# Patient Record
Sex: Male | Born: 1950 | Race: White | Hispanic: No | Smoking: Never smoker
Health system: Southern US, Community
[De-identification: ages and names within clinical notes are randomized; demographics above are authoritative.]

## PROBLEM LIST (undated history)

## (undated) DIAGNOSIS — I10 Essential (primary) hypertension: Secondary | ICD-10-CM

## (undated) DIAGNOSIS — G4700 Insomnia, unspecified: Secondary | ICD-10-CM

## (undated) DIAGNOSIS — Z6820 Body mass index (BMI) 20.0-20.9, adult: Secondary | ICD-10-CM

## (undated) DIAGNOSIS — E785 Hyperlipidemia, unspecified: Secondary | ICD-10-CM

## (undated) DIAGNOSIS — S62609A Fracture of unspecified phalanx of unspecified finger, initial encounter for closed fracture: Secondary | ICD-10-CM

## (undated) DIAGNOSIS — K5790 Diverticulosis of intestine, part unspecified, without perforation or abscess without bleeding: Secondary | ICD-10-CM

## (undated) DIAGNOSIS — Z860100 Personal history of colon polyps, unspecified: Secondary | ICD-10-CM

## (undated) DIAGNOSIS — N529 Male erectile dysfunction, unspecified: Secondary | ICD-10-CM

## (undated) HISTORY — DX: Male erectile dysfunction, unspecified: N52.9

## (undated) HISTORY — DX: Diverticulosis of intestine, part unspecified, without perforation or abscess without bleeding: K57.90

## (undated) HISTORY — DX: Hyperlipidemia, unspecified: E78.5

## (undated) HISTORY — DX: Body mass index (BMI) 20.0-20.9, adult: Z68.20

## (undated) HISTORY — DX: Essential (primary) hypertension: I10

## (undated) HISTORY — DX: Insomnia, unspecified: G47.00

## (undated) HISTORY — DX: Personal history of colon polyps, unspecified: Z86.0100

---

## 2013-02-06 IMAGING — CR DG SHOULDER 2+V*L*
2 series · 2 of 2 positions shown · non-contrast
Comparison: Plain films [DATE].

CLINICAL DATA: Pain.

LEFT SHOULDER - 2+ VIEW

[ap ext rot]
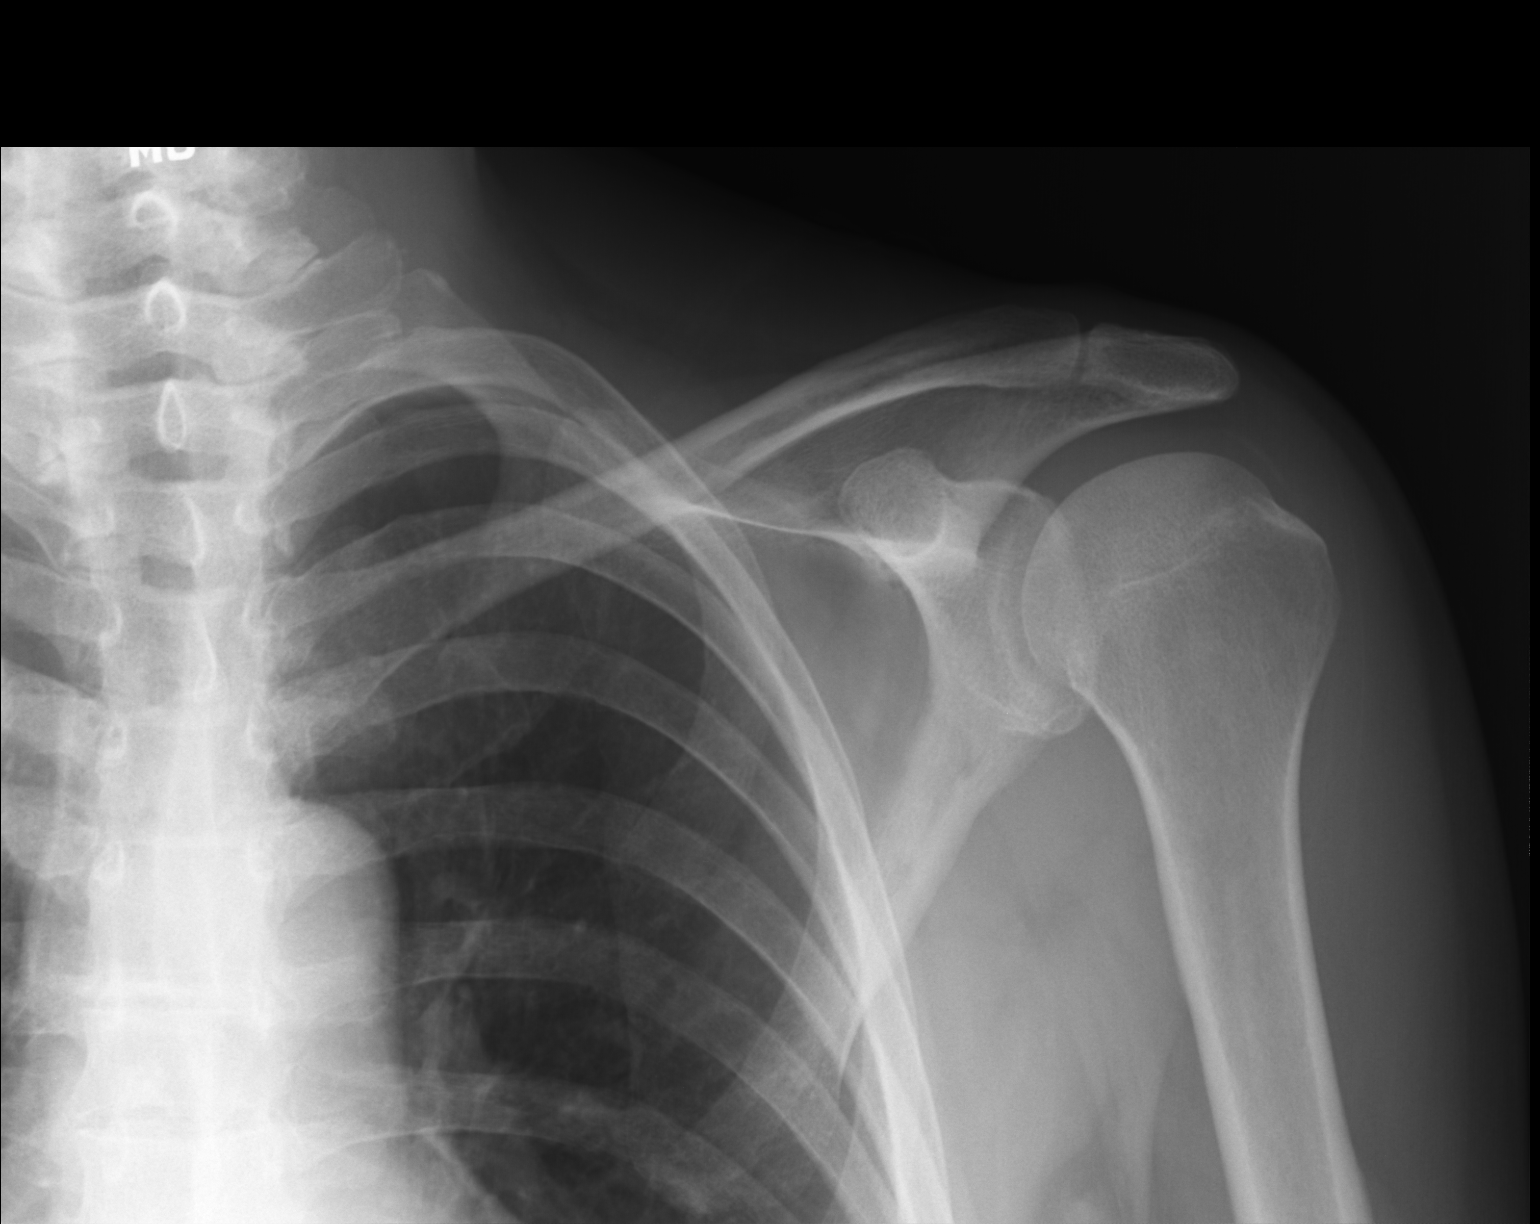

[ap int rot]
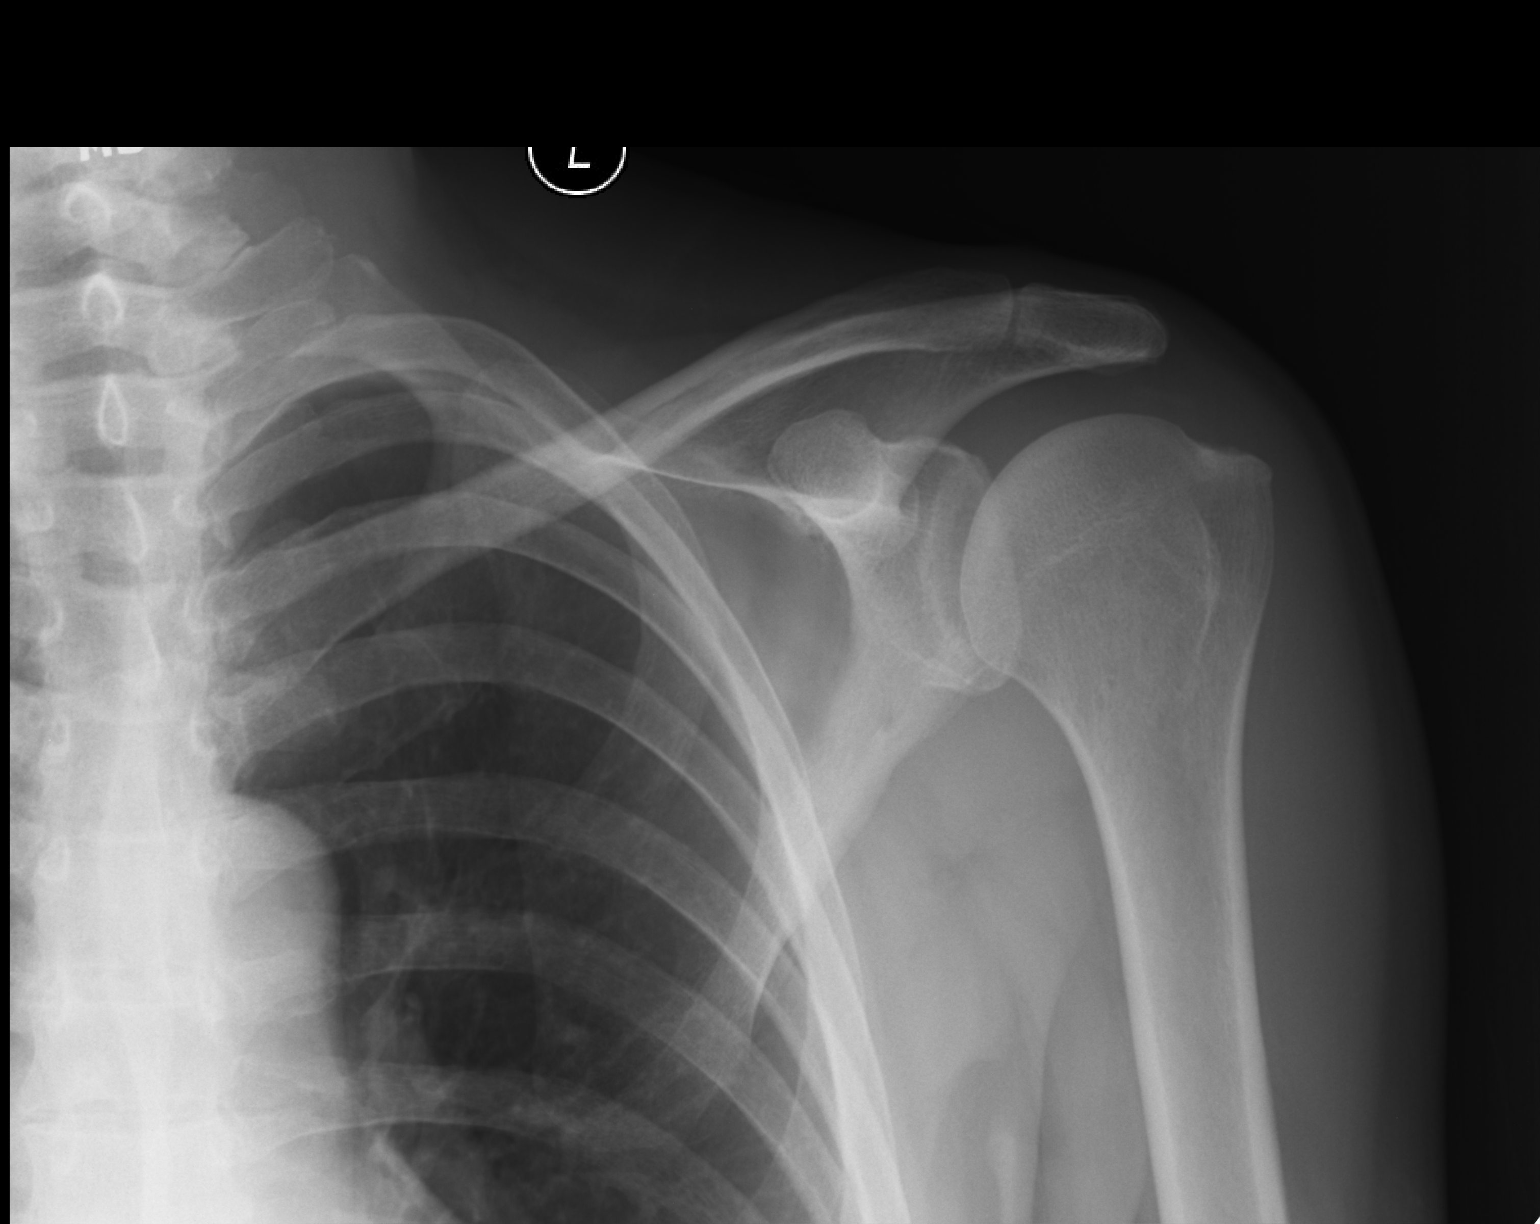

[2 of 2 positions shown; findings below may reference images not displayed]

FINDINGS: Imaged bones, joints and soft tissues appear normal.
IMPRESSION: Negative study.

## 2013-03-29 ENCOUNTER — Ambulatory Visit: Payer: Self-pay | Admitting: Family Medicine

## 2013-04-05 ENCOUNTER — Ambulatory Visit: Payer: Self-pay | Admitting: Family Medicine

## 2013-05-27 HISTORY — PX: BACK SURGERY: SHX140

## 2016-05-10 DIAGNOSIS — L814 Other melanin hyperpigmentation: Secondary | ICD-10-CM | POA: Diagnosis not present

## 2016-05-10 DIAGNOSIS — L821 Other seborrheic keratosis: Secondary | ICD-10-CM | POA: Diagnosis not present

## 2016-05-10 DIAGNOSIS — C44311 Basal cell carcinoma of skin of nose: Secondary | ICD-10-CM | POA: Diagnosis not present

## 2016-06-21 DIAGNOSIS — G4700 Insomnia, unspecified: Secondary | ICD-10-CM | POA: Diagnosis not present

## 2016-06-21 DIAGNOSIS — Z79899 Other long term (current) drug therapy: Secondary | ICD-10-CM | POA: Diagnosis not present

## 2016-06-21 DIAGNOSIS — Z87898 Personal history of other specified conditions: Secondary | ICD-10-CM | POA: Diagnosis not present

## 2016-06-21 DIAGNOSIS — I10 Essential (primary) hypertension: Secondary | ICD-10-CM | POA: Diagnosis not present

## 2016-06-21 DIAGNOSIS — E785 Hyperlipidemia, unspecified: Secondary | ICD-10-CM | POA: Diagnosis not present

## 2016-06-22 DIAGNOSIS — C44311 Basal cell carcinoma of skin of nose: Secondary | ICD-10-CM | POA: Diagnosis not present

## 2016-07-07 DIAGNOSIS — G4700 Insomnia, unspecified: Secondary | ICD-10-CM | POA: Diagnosis not present

## 2016-07-07 DIAGNOSIS — B029 Zoster without complications: Secondary | ICD-10-CM | POA: Diagnosis not present

## 2016-09-29 DIAGNOSIS — N529 Male erectile dysfunction, unspecified: Secondary | ICD-10-CM | POA: Diagnosis not present

## 2016-09-29 DIAGNOSIS — K5730 Diverticulosis of large intestine without perforation or abscess without bleeding: Secondary | ICD-10-CM | POA: Diagnosis not present

## 2016-10-03 DIAGNOSIS — L570 Actinic keratosis: Secondary | ICD-10-CM | POA: Diagnosis not present

## 2016-11-10 DIAGNOSIS — E785 Hyperlipidemia, unspecified: Secondary | ICD-10-CM | POA: Diagnosis not present

## 2016-11-10 DIAGNOSIS — I10 Essential (primary) hypertension: Secondary | ICD-10-CM | POA: Diagnosis not present

## 2016-11-10 DIAGNOSIS — Z125 Encounter for screening for malignant neoplasm of prostate: Secondary | ICD-10-CM | POA: Diagnosis not present

## 2016-11-10 DIAGNOSIS — Z136 Encounter for screening for cardiovascular disorders: Secondary | ICD-10-CM | POA: Diagnosis not present

## 2016-11-24 DIAGNOSIS — Z1211 Encounter for screening for malignant neoplasm of colon: Secondary | ICD-10-CM | POA: Diagnosis not present

## 2017-02-20 DIAGNOSIS — M25561 Pain in right knee: Secondary | ICD-10-CM | POA: Diagnosis not present

## 2017-02-21 DIAGNOSIS — S83241A Other tear of medial meniscus, current injury, right knee, initial encounter: Secondary | ICD-10-CM | POA: Diagnosis not present

## 2017-02-21 DIAGNOSIS — Y9373 Activity, racquet and hand sports: Secondary | ICD-10-CM | POA: Diagnosis not present

## 2017-02-21 DIAGNOSIS — M25561 Pain in right knee: Secondary | ICD-10-CM | POA: Diagnosis not present

## 2017-02-21 DIAGNOSIS — M25461 Effusion, right knee: Secondary | ICD-10-CM | POA: Diagnosis not present

## 2017-02-21 DIAGNOSIS — X58XXXA Exposure to other specified factors, initial encounter: Secondary | ICD-10-CM | POA: Diagnosis not present

## 2017-02-23 DIAGNOSIS — S83241D Other tear of medial meniscus, current injury, right knee, subsequent encounter: Secondary | ICD-10-CM | POA: Diagnosis not present

## 2017-02-23 DIAGNOSIS — M25561 Pain in right knee: Secondary | ICD-10-CM | POA: Diagnosis not present

## 2017-03-01 DIAGNOSIS — S83241D Other tear of medial meniscus, current injury, right knee, subsequent encounter: Secondary | ICD-10-CM | POA: Diagnosis not present

## 2017-03-01 DIAGNOSIS — M23203 Derangement of unspecified medial meniscus due to old tear or injury, right knee: Secondary | ICD-10-CM | POA: Diagnosis not present

## 2017-03-01 DIAGNOSIS — M6751 Plica syndrome, right knee: Secondary | ICD-10-CM | POA: Diagnosis not present

## 2017-03-01 DIAGNOSIS — M23211 Derangement of anterior horn of medial meniscus due to old tear or injury, right knee: Secondary | ICD-10-CM | POA: Diagnosis not present

## 2017-03-01 DIAGNOSIS — M23221 Derangement of posterior horn of medial meniscus due to old tear or injury, right knee: Secondary | ICD-10-CM | POA: Diagnosis not present

## 2017-03-01 DIAGNOSIS — M25561 Pain in right knee: Secondary | ICD-10-CM | POA: Diagnosis not present

## 2017-03-05 HISTORY — PX: KNEE ARTHROSCOPY: SUR90

## 2017-03-06 DIAGNOSIS — M25461 Effusion, right knee: Secondary | ICD-10-CM | POA: Diagnosis not present

## 2017-03-06 DIAGNOSIS — M25561 Pain in right knee: Secondary | ICD-10-CM | POA: Diagnosis not present

## 2017-03-06 DIAGNOSIS — M25661 Stiffness of right knee, not elsewhere classified: Secondary | ICD-10-CM | POA: Diagnosis not present

## 2017-03-08 DIAGNOSIS — M25561 Pain in right knee: Secondary | ICD-10-CM | POA: Diagnosis not present

## 2017-03-08 DIAGNOSIS — M25661 Stiffness of right knee, not elsewhere classified: Secondary | ICD-10-CM | POA: Diagnosis not present

## 2017-03-08 DIAGNOSIS — M25461 Effusion, right knee: Secondary | ICD-10-CM | POA: Diagnosis not present

## 2017-03-09 DIAGNOSIS — S83241D Other tear of medial meniscus, current injury, right knee, subsequent encounter: Secondary | ICD-10-CM | POA: Diagnosis not present

## 2017-03-13 DIAGNOSIS — M25561 Pain in right knee: Secondary | ICD-10-CM | POA: Diagnosis not present

## 2017-03-13 DIAGNOSIS — M25661 Stiffness of right knee, not elsewhere classified: Secondary | ICD-10-CM | POA: Diagnosis not present

## 2017-03-13 DIAGNOSIS — M25461 Effusion, right knee: Secondary | ICD-10-CM | POA: Diagnosis not present

## 2017-03-15 DIAGNOSIS — M25461 Effusion, right knee: Secondary | ICD-10-CM | POA: Diagnosis not present

## 2017-03-15 DIAGNOSIS — M25561 Pain in right knee: Secondary | ICD-10-CM | POA: Diagnosis not present

## 2017-03-15 DIAGNOSIS — M25661 Stiffness of right knee, not elsewhere classified: Secondary | ICD-10-CM | POA: Diagnosis not present

## 2017-03-20 DIAGNOSIS — M25561 Pain in right knee: Secondary | ICD-10-CM | POA: Diagnosis not present

## 2017-03-20 DIAGNOSIS — M25461 Effusion, right knee: Secondary | ICD-10-CM | POA: Diagnosis not present

## 2017-03-20 DIAGNOSIS — M25661 Stiffness of right knee, not elsewhere classified: Secondary | ICD-10-CM | POA: Diagnosis not present

## 2017-03-22 DIAGNOSIS — M25561 Pain in right knee: Secondary | ICD-10-CM | POA: Diagnosis not present

## 2017-03-22 DIAGNOSIS — M25461 Effusion, right knee: Secondary | ICD-10-CM | POA: Diagnosis not present

## 2017-03-22 DIAGNOSIS — M25661 Stiffness of right knee, not elsewhere classified: Secondary | ICD-10-CM | POA: Diagnosis not present

## 2017-03-22 DIAGNOSIS — M7989 Other specified soft tissue disorders: Secondary | ICD-10-CM | POA: Diagnosis not present

## 2017-03-24 ENCOUNTER — Other Ambulatory Visit: Payer: Self-pay | Admitting: Orthopedic Surgery

## 2017-03-24 DIAGNOSIS — S62624A Displaced fracture of medial phalanx of right ring finger, initial encounter for closed fracture: Secondary | ICD-10-CM | POA: Diagnosis not present

## 2017-03-24 DIAGNOSIS — S63434A Traumatic rupture of volar plate of right ring finger at metacarpophalangeal and interphalangeal joint, initial encounter: Secondary | ICD-10-CM | POA: Diagnosis not present

## 2017-03-27 ENCOUNTER — Encounter (HOSPITAL_BASED_OUTPATIENT_CLINIC_OR_DEPARTMENT_OTHER): Payer: Self-pay | Admitting: *Deleted

## 2017-03-28 ENCOUNTER — Ambulatory Visit (HOSPITAL_BASED_OUTPATIENT_CLINIC_OR_DEPARTMENT_OTHER)
Admission: RE | Admit: 2017-03-28 | Discharge: 2017-03-28 | Disposition: A | Discharge status: Home / Self Care | Payer: Medicare Other | Source: Ambulatory Visit | Attending: Orthopedic Surgery | Admitting: Orthopedic Surgery

## 2017-03-28 ENCOUNTER — Encounter (HOSPITAL_BASED_OUTPATIENT_CLINIC_OR_DEPARTMENT_OTHER): Payer: Self-pay | Admitting: *Deleted

## 2017-03-28 ENCOUNTER — Ambulatory Visit (HOSPITAL_BASED_OUTPATIENT_CLINIC_OR_DEPARTMENT_OTHER): Payer: Medicare Other | Admitting: Certified Registered"

## 2017-03-28 ENCOUNTER — Encounter (HOSPITAL_BASED_OUTPATIENT_CLINIC_OR_DEPARTMENT_OTHER)
Admission: RE | Disposition: A | Discharge status: Home / Self Care | Payer: Self-pay | Source: Ambulatory Visit | Attending: Orthopedic Surgery

## 2017-03-28 DIAGNOSIS — S62622A Displaced fracture of medial phalanx of right middle finger, initial encounter for closed fracture: Secondary | ICD-10-CM | POA: Diagnosis not present

## 2017-03-28 DIAGNOSIS — Z9889 Other specified postprocedural states: Secondary | ICD-10-CM | POA: Insufficient documentation

## 2017-03-28 DIAGNOSIS — Z79899 Other long term (current) drug therapy: Secondary | ICD-10-CM | POA: Diagnosis not present

## 2017-03-28 DIAGNOSIS — X58XXXA Exposure to other specified factors, initial encounter: Secondary | ICD-10-CM | POA: Diagnosis not present

## 2017-03-28 DIAGNOSIS — G4730 Sleep apnea, unspecified: Secondary | ICD-10-CM | POA: Insufficient documentation

## 2017-03-28 DIAGNOSIS — S62624A Displaced fracture of medial phalanx of right ring finger, initial encounter for closed fracture: Secondary | ICD-10-CM | POA: Diagnosis not present

## 2017-03-28 HISTORY — DX: Fracture of unspecified phalanx of unspecified finger, initial encounter for closed fracture: S62.609A

## 2017-03-28 HISTORY — PX: EXTERNAL FIXATION OF FINGER: SHX6745

## 2017-03-28 SURGERY — EXTERNAL FIXATION, FINGER
Anesthesia: Monitor Anesthesia Care | Site: Finger | Laterality: Right

## 2017-03-28 MED ORDER — MIDAZOLAM HCL 2 MG/2ML IJ SOLN
1.0000 mg | INTRAMUSCULAR | Status: DC | PRN
  Administered 2017-03-28: 1 mg via INTRAVENOUS
  Administered 2017-03-28: 2 mg via INTRAVENOUS
  Administered 2017-03-28: 1 mg via INTRAVENOUS

## 2017-03-28 MED ORDER — PROPOFOL 500 MG/50ML IV EMUL
INTRAVENOUS | Status: DC | PRN
  Administered 2017-03-28: 100 ug/kg/min via INTRAVENOUS

## 2017-03-28 MED ORDER — SCOPOLAMINE 1 MG/3DAYS TD PT72: 1.0000 | MEDICATED_PATCH | Freq: Once | TRANSDERMAL | Status: DC | PRN

## 2017-03-28 MED ORDER — CHLORHEXIDINE GLUCONATE 4 % EX LIQD: 60.0000 mL | Freq: Once | CUTANEOUS | Status: DC

## 2017-03-28 MED ORDER — CEFAZOLIN SODIUM-DEXTROSE 2-4 GM/100ML-% IV SOLN: 2.0000 g | INTRAVENOUS | Status: DC

## 2017-03-28 MED ORDER — LACTATED RINGERS IV SOLN
INTRAVENOUS | Status: DC
  Administered 2017-03-28 (×2): via INTRAVENOUS

## 2017-03-28 MED ORDER — HYDROCODONE-ACETAMINOPHEN 5-325 MG PO TABS: ORAL_TABLET | ORAL | 0 refills | Status: DC

## 2017-03-28 MED ORDER — MIDAZOLAM HCL 2 MG/2ML IJ SOLN
INTRAMUSCULAR | Status: AC
  Filled 2017-03-28: qty 2

## 2017-03-28 MED ORDER — ONDANSETRON HCL 4 MG/2ML IJ SOLN
INTRAMUSCULAR | Status: DC | PRN
  Administered 2017-03-28: 4 mg via INTRAVENOUS

## 2017-03-28 MED ORDER — FENTANYL CITRATE (PF) 100 MCG/2ML IJ SOLN
INTRAMUSCULAR | Status: AC
  Filled 2017-03-28: qty 2

## 2017-03-28 MED ORDER — FENTANYL CITRATE (PF) 100 MCG/2ML IJ SOLN
50.0000 ug | INTRAMUSCULAR | Status: AC | PRN
  Administered 2017-03-28 (×3): 50 ug via INTRAVENOUS

## 2017-03-28 MED ORDER — PROPOFOL 10 MG/ML IV BOLUS
INTRAVENOUS | Status: DC | PRN
  Administered 2017-03-28: 20 mg via INTRAVENOUS
  Administered 2017-03-28: 30 mg via INTRAVENOUS

## 2017-03-28 MED ORDER — CEFAZOLIN SODIUM-DEXTROSE 2-4 GM/100ML-% IV SOLN
INTRAVENOUS | Status: AC
  Filled 2017-03-28: qty 100

## 2017-03-28 MED ORDER — FENTANYL CITRATE (PF) 100 MCG/2ML IJ SOLN: 25.0000 ug | INTRAMUSCULAR | Status: DC | PRN

## 2017-03-28 MED ORDER — ROPIVACAINE HCL 5 MG/ML IJ SOLN
INTRAMUSCULAR | Status: DC | PRN
  Administered 2017-03-28: 30 mL via PERINEURAL

## 2017-03-28 SURGICAL SUPPLY — 62 items
BANDAGE ACE 3X5.8 VEL STRL LF (GAUZE/BANDAGES/DRESSINGS) ×3 IMPLANT
BLADE MINI RND TIP GREEN BEAV (BLADE) IMPLANT
BLADE SURG 15 STRL LF DISP TIS (BLADE) ×4 IMPLANT
BLADE SURG 15 STRL SS (BLADE) ×8
BNDG CMPR 9X4 STRL LF SNTH (GAUZE/BANDAGES/DRESSINGS) ×2
BNDG ELASTIC 2X5.8 VLCR STR LF (GAUZE/BANDAGES/DRESSINGS) IMPLANT
BNDG ESMARK 4X9 LF (GAUZE/BANDAGES/DRESSINGS) ×3 IMPLANT
BNDG GAUZE ELAST 4 BULKY (GAUZE/BANDAGES/DRESSINGS) ×4 IMPLANT
CHLORAPREP W/TINT 26ML (MISCELLANEOUS) ×4 IMPLANT
CORDS BIPOLAR (ELECTRODE) ×1 IMPLANT
COVER BACK TABLE 60X90IN (DRAPES) ×4 IMPLANT
COVER MAYO STAND STRL (DRAPES) ×4 IMPLANT
CUFF TOURNIQUET SINGLE 18IN (TOURNIQUET CUFF) ×4 IMPLANT
DRAPE EXTREMITY T 121X128X90 (DRAPE) ×4 IMPLANT
DRAPE OEC MINIVIEW 54X84 (DRAPES) ×3 IMPLANT
DRAPE SURG 17X23 STRL (DRAPES) ×4 IMPLANT
GAUZE SPONGE 4X4 12PLY STRL (GAUZE/BANDAGES/DRESSINGS) ×4 IMPLANT
GAUZE XEROFORM 1X8 LF (GAUZE/BANDAGES/DRESSINGS) ×4 IMPLANT
GLOVE BIO SURGEON STRL SZ7.5 (GLOVE) ×4 IMPLANT
GLOVE BIOGEL PI IND STRL 7.0 (GLOVE) ×2 IMPLANT
GLOVE BIOGEL PI IND STRL 8 (GLOVE) ×2 IMPLANT
GLOVE BIOGEL PI IND STRL 8.5 (GLOVE) ×1 IMPLANT
GLOVE BIOGEL PI INDICATOR 7.0 (GLOVE) ×4
GLOVE BIOGEL PI INDICATOR 8 (GLOVE) ×2
GLOVE BIOGEL PI INDICATOR 8.5 (GLOVE) ×2
GLOVE ECLIPSE 6.5 STRL STRAW (GLOVE) ×3 IMPLANT
GLOVE SURG ORTHO 8.0 STRL STRW (GLOVE) ×3 IMPLANT
GOWN STRL REUS W/ TWL LRG LVL3 (GOWN DISPOSABLE) ×2 IMPLANT
GOWN STRL REUS W/TWL LRG LVL3 (GOWN DISPOSABLE) ×4
GOWN STRL REUS W/TWL XL LVL3 (GOWN DISPOSABLE) ×6 IMPLANT
K-WIRE .035X4 (WIRE) ×3 IMPLANT
K-WIRE .045X4 (WIRE) ×6 IMPLANT
K-WIRE 9  SMOOTH .045 (WIRE) ×6 IMPLANT
NDL HYPO 25X1 1.5 SAFETY (NEEDLE) IMPLANT
NEEDLE HYPO 22GX1.5 SAFETY (NEEDLE) IMPLANT
NEEDLE HYPO 25X1 1.5 SAFETY (NEEDLE) IMPLANT
NS IRRIG 1000ML POUR BTL (IV SOLUTION) ×4 IMPLANT
PACK BASIN DAY SURGERY FS (CUSTOM PROCEDURE TRAY) ×4 IMPLANT
PAD CAST 3X4 CTTN HI CHSV (CAST SUPPLIES) ×1 IMPLANT
PAD CAST 4YDX4 CTTN HI CHSV (CAST SUPPLIES) IMPLANT
PADDING CAST ABS 4INX4YD NS (CAST SUPPLIES) ×2
PADDING CAST ABS COTTON 4X4 ST (CAST SUPPLIES) ×2 IMPLANT
PADDING CAST COTTON 3X4 STRL (CAST SUPPLIES) ×4
PADDING CAST COTTON 4X4 STRL (CAST SUPPLIES)
RUBBERBAND STERILE (MISCELLANEOUS) ×3 IMPLANT
SLEEVE SCD COMPRESS KNEE MED (MISCELLANEOUS) IMPLANT
SLING ARM FOAM STRAP LRG (SOFTGOODS) ×3 IMPLANT
SPLINT PLASTER CAST XFAST 3X15 (CAST SUPPLIES) ×10 IMPLANT
SPLINT PLASTER CAST XFAST 4X15 (CAST SUPPLIES) IMPLANT
SPLINT PLASTER XTRA FAST SET 4 (CAST SUPPLIES)
SPLINT PLASTER XTRA FASTSET 3X (CAST SUPPLIES) ×20
STOCKINETTE 4X48 STRL (DRAPES) ×4 IMPLANT
SUT ETHILON 3 0 PS 1 (SUTURE) IMPLANT
SUT ETHILON 4 0 PS 2 18 (SUTURE) ×1 IMPLANT
SUT MERSILENE 4 0 P 3 (SUTURE) IMPLANT
SUT VIC AB 3-0 PS1 18 (SUTURE)
SUT VIC AB 3-0 PS1 18XBRD (SUTURE) IMPLANT
SUT VICRYL 4-0 PS2 18IN ABS (SUTURE) IMPLANT
SYR BULB 3OZ (MISCELLANEOUS) ×4 IMPLANT
SYR CONTROL 10ML LL (SYRINGE) IMPLANT
TOWEL OR 17X24 6PK STRL BLUE (TOWEL DISPOSABLE) ×8 IMPLANT
UNDERPAD 30X30 (UNDERPADS AND DIAPERS) ×4 IMPLANT

## 2017-03-28 NOTE — Anesthesia Procedure Notes (Signed)
Anesthesia Regional Block: Axillary brachial plexus block   Pre-Anesthetic Checklist: ,, timeout performed, Correct Patient, Correct Site, Correct Laterality, Correct Procedure, Correct Position, site marked, Risks and benefits discussed, pre-op evaluation,  At surgeon's request and post-op pain management  Laterality: Right  Prep: chloraprep       Needles:   Needle Type: Echogenic Needle     Needle Length: 9cm  Needle Gauge: 21     Additional Needles:   Procedures: ultrasound guided,,,,,,,,  Narrative:  Start time: 03/28/2017 2:30 PM End time: 03/28/2017 2:37 PM Injection made incrementally with aspirations every 5 mL. Anesthesiologist: Lyndle Herrlich

## 2017-03-28 NOTE — Progress Notes (Signed)
Assisted Dr. Lyndle Herrlich with right, ultrasound guided, axillary block. Side rails up, monitors on throughout procedure. See vital signs in flow sheet. Tolerated Procedure well.

## 2017-03-28 NOTE — Transfer of Care (Signed)
Immediate Anesthesia Transfer of Care Note  Patient: Nicholas Wong  Procedure(s) Performed: Procedure(s): RIGHT RING FINGER CLOSED REDUCTION WITH EXTERNAL FIXATION (Right)  Patient Location: PACU  Anesthesia Type:MAC and Regional  Level of Consciousness: awake, alert  and oriented  Airway & Oxygen Therapy: Patient Spontanous Breathing and Patient connected to face mask oxygen  Post-op Assessment: Report given to RN and Post -op Vital signs reviewed and stable  Post vital signs: Reviewed and stable  Last Vitals:  Vitals:   03/28/17 1441 03/28/17 1445  BP:  112/64  Pulse: 67 68  Resp: 18 15  Temp:      Last Pain:  Vitals:   03/28/17 1357  TempSrc: Oral         Complications: No apparent anesthesia complications

## 2017-03-28 NOTE — H&P (Signed)
Nicholas Wong is an 66 y.o. male.   Chief Complaint: right ring finger fracture HPI: 66 yo rhd male states he injured right ring finger last week when a drill spun in his hand.  Seen at outside facility where XR revealed fracture of right ring finger.  Splinted and followed up in office.  He wishes to proceed with operative fixation of the fracture.  Allergies: No Known Allergies  Past Medical History:  Diagnosis Date  . Fingers fractured    right ring    Past Surgical History:  Procedure Laterality Date  . BACK SURGERY  05/27/2013  . KNEE ARTHROSCOPY Right 03/2017    Family History: History reviewed. No pertinent family history.  Social History:   reports that he has never smoked. He has never used smokeless tobacco. He reports that he drinks alcohol. He reports that he does not use drugs.  Medications: Medications Prior to Admission  Medication Sig Dispense Refill  . zolpidem (AMBIEN) 5 MG tablet Take 5 mg by mouth at bedtime as needed for sleep.      No results found for this or any previous visit (from the past 48 hour(s)).  No results found.   A comprehensive review of systems was negative.  Blood pressure 112/64, pulse 68, temperature 98.8 F (37.1 C), temperature source Oral, resp. rate 15, height 5\' 8"  (1.727 m), weight 60.9 kg (134 lb 3.2 oz), SpO2 100 %.  General appearance: alert, cooperative and appears stated age Head: Normocephalic, without obvious abnormality, atraumatic Neck: supple, symmetrical, trachea midline Resp: clear to auscultation bilaterally Cardio: regular rate and rhythm GI: non-tender Extremities: Intact sensation and capillary refill all digits.  +epl/fpl/io.  No wounds.  Pulses: 2+ and symmetric Skin: Skin color, texture, turgor normal. No rashes or lesions Neurologic: Grossly normal Incision/Wound:none  Assessment/Plan Right ring finger intraarticular middle phalanx base fracture.  Non operative and operative treatment options were  discussed with the patient and patient wishes to proceed with operative treatment. Risks, benefits, and alternatives of surgery were discussed and the patient agrees with the plan of care.   Eagle Pitta R 03/28/2017, 3:47 PM

## 2017-03-28 NOTE — Brief Op Note (Signed)
03/28/2017  4:46 PM  PATIENT:  Nicholas Wong  66 y.o. male  PRE-OPERATIVE DIAGNOSIS:  RIGHT RING FINGER FRACTURE, CLOSED DISPLACED FRACTURE OF MIDDLE PHALANX OF RIGHT RING FINGER  [phone]  POST-OPERATIVE DIAGNOSIS:  RIGHT RING FINGER FRACTURE, CLOSED DISPLACED FRACTURE OF MIDDLE PHALANX OF RIGHT RING FINGER  S62.624A  PROCEDURE:  Procedure(s): RIGHT RING FINGER CLOSED REDUCTION WITH EXTERNAL FIXATION (Right)  SURGEON:  Surgeon(s) and Role:    * Leanora Cover, MD - Primary    * Daryll Brod, MD - Assisting  PHYSICIAN ASSISTANT:   ASSISTANTS: Daryll Brod, MD   ANESTHESIA:   regional and IV sedation  EBL:  Total I/O In: 1000 [I.V.:1000] Out: -   BLOOD ADMINISTERED:none  DRAINS: none   LOCAL MEDICATIONS USED:  NONE  SPECIMEN:  No Specimen  DISPOSITION OF SPECIMEN:  N/A  COUNTS:  YES  TOURNIQUET:   Total Tourniquet Time Documented: Upper Arm (Right) - 27 minutes Total: Upper Arm (Right) - 27 minutes   DICTATION: .Other Dictation: Dictation Number [id number]  PLAN OF CARE: Discharge to home after PACU  PATIENT DISPOSITION:  PACU - hemodynamically stable.

## 2017-03-28 NOTE — Op Note (Signed)
I assisted Surgeon(s) and Role:    * Leanora Cover, MD - Primary    * Daryll Brod, MD - Assisting on the Procedure(s): RIGHT RING FINGER CLOSED REDUCTION WITH EXTERNAL FIXATION on 03/28/2017.  I provided assistance on this case as follows: manipulation, reduction traction, fabrication of the external fixator. Application of the fixator, application of the dressings and splint. I was present for the entire case.  Electronically signed by: Wynonia Sours, MD Date: 03/28/2017 Time: 4:48 PM

## 2017-03-28 NOTE — Discharge Instructions (Addendum)

## 2017-03-28 NOTE — Op Note (Signed)
[id number] 

## 2017-03-28 NOTE — Anesthesia Preprocedure Evaluation (Signed)
Anesthesia Evaluation  Patient identified by MRN, date of birth, ID band Patient awake    Reviewed: Allergy & Precautions, H&P , Patient's Chart, lab work & pertinent test results, reviewed documented beta blocker date and time   Airway Mallampati: II  TM Distance: >3 FB Neck ROM: full    Dental no notable dental hx.    Pulmonary sleep apnea ,    Pulmonary exam normal breath sounds clear to auscultation       Cardiovascular  Rhythm:regular Rate:Normal     Neuro/Psych    GI/Hepatic   Endo/Other    Renal/GU      Musculoskeletal   Abdominal   Peds  Hematology   Anesthesia Other Findings   Reproductive/Obstetrics                             Anesthesia Physical Anesthesia Plan  ASA: II  Anesthesia Plan: MAC   Post-op Pain Management:  Regional for Post-op pain   Induction: Intravenous  Airway Management Planned: Mask and Natural Airway  Additional Equipment:   Intra-op Plan:   Post-operative Plan:   Informed Consent: I have reviewed the patients History and Physical, chart, labs and discussed the procedure including the risks, benefits and alternatives for the proposed anesthesia with the patient or authorized representative who has indicated his/her understanding and acceptance.   Dental Advisory Given  Plan Discussed with: CRNA and Surgeon  Anesthesia Plan Comments:         Anesthesia Quick Evaluation

## 2017-03-29 ENCOUNTER — Encounter (HOSPITAL_BASED_OUTPATIENT_CLINIC_OR_DEPARTMENT_OTHER): Payer: Self-pay | Admitting: Orthopedic Surgery

## 2017-03-29 NOTE — Anesthesia Postprocedure Evaluation (Signed)
Anesthesia Post Note  Patient: Nicholas Wong  Procedure(s) Performed: Procedure(s) (LRB): RIGHT RING FINGER CLOSED REDUCTION WITH EXTERNAL FIXATION (Right)  Patient location during evaluation: PACU Anesthesia Type: MAC Level of consciousness: awake and alert Pain management: satisfactory to patient Vital Signs Assessment: post-procedure vital signs reviewed and stable Respiratory status: spontaneous breathing, nonlabored ventilation and respiratory function stable Cardiovascular status: stable and blood pressure returned to baseline Anesthetic complications: no       Last Vitals:  Vitals:   03/28/17 1707 03/28/17 1724  BP: 114/73 114/73  Pulse: 60 60  Resp: 18 16  Temp:  36.4 C    Last Pain:  Vitals:   03/28/17 1724  TempSrc: Oral  PainSc: 0-No pain                 Sheryll Dymek EDWARD

## 2017-03-29 NOTE — Op Note (Signed)
NAMESHIMON, TROWBRIDGE NO.:  [id number]  MEDICAL RECORD NO.:  [id number]  LOCATION:                                 FACILITY:  PHYSICIAN:  Leanora Cover, MD             DATE OF BIRTH:  DATE OF PROCEDURE:  03/28/2017 DATE OF DISCHARGE:                              OPERATIVE REPORT   PREOPERATIVE DIAGNOSIS:  Right ring finger middle phalanx intra- articular base fracture.  POSTOPERATIVE DIAGNOSIS:  Right ring finger middle phalanx intra- articular base fracture.  PROCEDURE:   1. Right ring finger closed reduction of middle phalanx intra-articular base fracture 2. Application of dynamic external fixation right ring finger  SURGEON:  Leanora Cover, MD.  ASSISTANT:  Daryll Brod, MD.  ANESTHESIA:  Regional with sedation.  INTRAVENOUS FLUIDS:  Per anesthesia flow sheet.  ESTIMATED BLOOD LOSS:  Minimal.  COMPLICATIONS:  None.  SPECIMENS:  None.  TOURNIQUET TIME:  27 minutes.  DISPOSITION:  Stable to PACU.  INDICATIONS:  Mr. Dirr is a 66 year old male, who injured his right ring finger last week while using a drill, which spun on him.  He was seen at outside facility, where radiographs were taken revealing a middle phalanx fracture.  He was splinted and followed up in the office.  We discussed treatment options.  He wished to undergo operative fixation. Risks, benefits, and alternatives of surgery were discussed including the risk of blood loss; infection; damage to nerves, vessels, tendons, ligaments, bone; failure of surgery; need for additional surgery; complications with wound healing; continued pain; nonunion; malunion; stiffness.  He voiced understanding of these risks and elected to proceed.  OPERATIVE COURSE:  After being identified preoperatively by myself, the patient and I agreed upon procedure and site of procedure.  Surgical site was marked.  The risks, benefits, and alternatives of surgery were reviewed and he wished to proceed.  Surgical  consent had been signed. He was given IV Ancef as preoperative antibiotic prophylaxis.  A regional block was performed by Anesthesia in preoperative holding.  He was transferred to the operating room and placed on the operating room table in supine position with the right upper extremity on arm board. Sedation was induced by anesthesiologist.  Right upper extremity was prepped and draped in normal sterile orthopedic fashion.  Surgical pause was performed between surgeons, anesthesia, and operating staff; and all were in agreement as to the patient, procedure, and site of procedure. C-arm was used in AP and lateral projections throughout the case.  A closed reduction of the fracture was performed.  Good articular congruency was obtained.  There was no dorsal subluxation with the reduction.  The tourniquet was inflated to 250 mmHg after exsanguination of the limb with an Esmarch bandage.  A 0.045-inch K-wire was advanced across the axis of rotation of the proximal phalangeal condyle. Additional 0.035-inch K-wire was advanced distal to the fracture site in the middle phalanx in a coplanar fashion.  A wire was then bent in the appropriate shape for the external fixator.  This was placed over the proximal wire.  Her bands were used to apply traction to the digits using  the second wire.  C-arm was used in AP and lateral projections to ensure appropriate reduction and position of hardware which was the case.  Good articular reduction was obtained.  The pins were bent appropriately.  The pin sites were dressed with sterile Xeroform and 4x4s.  The hand was then wrapped lightly with a Kerlix bandage.  A volar splint was placed and wrapped lightly with Ace bandage.  Tourniquet was deflated at 27 minutes.  Fingertips were pink with brisk capillary refill after deflation of tourniquet.  Operative drapes were broken down.  The patient was awoken from anesthesia safely.  He was transferred back to the  stretcher and taken to PACU in stable condition. I will see him back in the office in 1 week for postoperative followup. I will give him Norco 5/325 one to two p.o. q.6 hours p.r.n. pain, dispensed #20.     Leanora Cover, MD     KK/MEDQ  D:  03/28/2017  T:  03/29/2017  Job:  [id number]

## 2017-04-04 DIAGNOSIS — S62624A Displaced fracture of medial phalanx of right ring finger, initial encounter for closed fracture: Secondary | ICD-10-CM | POA: Diagnosis not present

## 2017-04-04 DIAGNOSIS — M79644 Pain in right finger(s): Secondary | ICD-10-CM | POA: Diagnosis not present

## 2017-04-12 DIAGNOSIS — S62628A Displaced fracture of medial phalanx of other finger, initial encounter for closed fracture: Secondary | ICD-10-CM

## 2017-04-12 DIAGNOSIS — M25649 Stiffness of unspecified hand, not elsewhere classified: Secondary | ICD-10-CM

## 2017-04-12 DIAGNOSIS — M79644 Pain in right finger(s): Secondary | ICD-10-CM

## 2017-04-12 DIAGNOSIS — S62624A Displaced fracture of medial phalanx of right ring finger, initial encounter for closed fracture: Secondary | ICD-10-CM | POA: Diagnosis not present

## 2017-04-12 HISTORY — DX: Pain in right finger(s): M79.644

## 2017-04-12 HISTORY — DX: Stiffness of unspecified hand, not elsewhere classified: M25.649

## 2017-04-12 HISTORY — DX: Displaced fracture of middle phalanx of other finger, initial encounter for closed fracture: S62.628A

## 2017-04-13 DIAGNOSIS — M25661 Stiffness of right knee, not elsewhere classified: Secondary | ICD-10-CM | POA: Diagnosis not present

## 2017-04-13 DIAGNOSIS — M25461 Effusion, right knee: Secondary | ICD-10-CM | POA: Diagnosis not present

## 2017-04-13 DIAGNOSIS — M25561 Pain in right knee: Secondary | ICD-10-CM | POA: Diagnosis not present

## 2017-04-18 DIAGNOSIS — M79644 Pain in right finger(s): Secondary | ICD-10-CM | POA: Diagnosis not present

## 2017-04-18 DIAGNOSIS — S62624D Displaced fracture of medial phalanx of right ring finger, subsequent encounter for fracture with routine healing: Secondary | ICD-10-CM | POA: Diagnosis not present

## 2017-04-18 DIAGNOSIS — S62624A Displaced fracture of medial phalanx of right ring finger, initial encounter for closed fracture: Secondary | ICD-10-CM | POA: Diagnosis not present

## 2017-04-18 DIAGNOSIS — M25649 Stiffness of unspecified hand, not elsewhere classified: Secondary | ICD-10-CM | POA: Diagnosis not present

## 2017-04-19 DIAGNOSIS — M25461 Effusion, right knee: Secondary | ICD-10-CM | POA: Diagnosis not present

## 2017-04-19 DIAGNOSIS — M25561 Pain in right knee: Secondary | ICD-10-CM | POA: Diagnosis not present

## 2017-04-19 DIAGNOSIS — M25661 Stiffness of right knee, not elsewhere classified: Secondary | ICD-10-CM | POA: Diagnosis not present

## 2017-04-22 DIAGNOSIS — L570 Actinic keratosis: Secondary | ICD-10-CM | POA: Diagnosis not present

## 2017-04-22 DIAGNOSIS — B351 Tinea unguium: Secondary | ICD-10-CM | POA: Diagnosis not present

## 2017-04-26 DIAGNOSIS — M79644 Pain in right finger(s): Secondary | ICD-10-CM | POA: Diagnosis not present

## 2017-04-26 DIAGNOSIS — M25649 Stiffness of unspecified hand, not elsewhere classified: Secondary | ICD-10-CM | POA: Diagnosis not present

## 2017-04-26 DIAGNOSIS — S62624A Displaced fracture of medial phalanx of right ring finger, initial encounter for closed fracture: Secondary | ICD-10-CM | POA: Diagnosis not present

## 2017-04-28 DIAGNOSIS — M25661 Stiffness of right knee, not elsewhere classified: Secondary | ICD-10-CM | POA: Diagnosis not present

## 2017-04-28 DIAGNOSIS — M25461 Effusion, right knee: Secondary | ICD-10-CM | POA: Diagnosis not present

## 2017-04-28 DIAGNOSIS — M25561 Pain in right knee: Secondary | ICD-10-CM | POA: Diagnosis not present

## 2017-05-02 DIAGNOSIS — S62624D Displaced fracture of medial phalanx of right ring finger, subsequent encounter for fracture with routine healing: Secondary | ICD-10-CM | POA: Diagnosis not present

## 2017-05-04 DIAGNOSIS — M79644 Pain in right finger(s): Secondary | ICD-10-CM | POA: Diagnosis not present

## 2017-05-04 DIAGNOSIS — M25649 Stiffness of unspecified hand, not elsewhere classified: Secondary | ICD-10-CM | POA: Diagnosis not present

## 2017-05-04 DIAGNOSIS — S62624A Displaced fracture of medial phalanx of right ring finger, initial encounter for closed fracture: Secondary | ICD-10-CM | POA: Diagnosis not present

## 2017-05-09 DIAGNOSIS — M79644 Pain in right finger(s): Secondary | ICD-10-CM | POA: Diagnosis not present

## 2017-05-09 DIAGNOSIS — S62624D Displaced fracture of medial phalanx of right ring finger, subsequent encounter for fracture with routine healing: Secondary | ICD-10-CM | POA: Diagnosis not present

## 2017-05-09 DIAGNOSIS — M25649 Stiffness of unspecified hand, not elsewhere classified: Secondary | ICD-10-CM | POA: Diagnosis not present

## 2017-05-16 DIAGNOSIS — S62624D Displaced fracture of medial phalanx of right ring finger, subsequent encounter for fracture with routine healing: Secondary | ICD-10-CM | POA: Diagnosis not present

## 2017-05-16 DIAGNOSIS — M25649 Stiffness of unspecified hand, not elsewhere classified: Secondary | ICD-10-CM | POA: Diagnosis not present

## 2017-05-18 DIAGNOSIS — I10 Essential (primary) hypertension: Secondary | ICD-10-CM | POA: Diagnosis not present

## 2017-05-18 DIAGNOSIS — Z79899 Other long term (current) drug therapy: Secondary | ICD-10-CM | POA: Diagnosis not present

## 2017-05-18 DIAGNOSIS — E785 Hyperlipidemia, unspecified: Secondary | ICD-10-CM | POA: Diagnosis not present

## 2017-05-18 DIAGNOSIS — Z1389 Encounter for screening for other disorder: Secondary | ICD-10-CM | POA: Diagnosis not present

## 2017-05-18 DIAGNOSIS — G4700 Insomnia, unspecified: Secondary | ICD-10-CM | POA: Diagnosis not present

## 2017-05-24 DIAGNOSIS — M25649 Stiffness of unspecified hand, not elsewhere classified: Secondary | ICD-10-CM | POA: Diagnosis not present

## 2017-05-24 DIAGNOSIS — S62624D Displaced fracture of medial phalanx of right ring finger, subsequent encounter for fracture with routine healing: Secondary | ICD-10-CM | POA: Diagnosis not present

## 2017-05-24 DIAGNOSIS — M79644 Pain in right finger(s): Secondary | ICD-10-CM | POA: Diagnosis not present

## 2017-05-31 DIAGNOSIS — S62624D Displaced fracture of medial phalanx of right ring finger, subsequent encounter for fracture with routine healing: Secondary | ICD-10-CM | POA: Diagnosis not present

## 2017-05-31 DIAGNOSIS — M79644 Pain in right finger(s): Secondary | ICD-10-CM | POA: Diagnosis not present

## 2017-05-31 DIAGNOSIS — S62624A Displaced fracture of medial phalanx of right ring finger, initial encounter for closed fracture: Secondary | ICD-10-CM | POA: Diagnosis not present

## 2017-05-31 DIAGNOSIS — M25649 Stiffness of unspecified hand, not elsewhere classified: Secondary | ICD-10-CM | POA: Diagnosis not present

## 2017-06-14 DIAGNOSIS — M79644 Pain in right finger(s): Secondary | ICD-10-CM | POA: Diagnosis not present

## 2017-06-14 DIAGNOSIS — S62624D Displaced fracture of medial phalanx of right ring finger, subsequent encounter for fracture with routine healing: Secondary | ICD-10-CM | POA: Diagnosis not present

## 2017-06-14 DIAGNOSIS — M25649 Stiffness of unspecified hand, not elsewhere classified: Secondary | ICD-10-CM | POA: Diagnosis not present

## 2017-06-16 ENCOUNTER — Encounter (HOSPITAL_BASED_OUTPATIENT_CLINIC_OR_DEPARTMENT_OTHER): Payer: Self-pay | Admitting: Orthopedic Surgery

## 2017-06-16 NOTE — Anesthesia Postprocedure Evaluation (Signed)
Anesthesia Post Note  Patient: Nicholas Wong  Procedure(s) Performed: Procedure(s) (LRB): RIGHT RING FINGER CLOSED REDUCTION WITH EXTERNAL FIXATION (Right)     Anesthesia Post Evaluation  Last Vitals:  Vitals:   03/28/17 1707 03/28/17 1724  BP: 114/73 114/73  Pulse: 60 60  Resp: 18 16  Temp:  36.4 C    Last Pain:  Vitals:   03/28/17 1724  TempSrc: Oral  PainSc: 0-No pain                 Rjay Revolorio EDWARD

## 2017-06-16 NOTE — Addendum Note (Signed)
Addendum  created 06/16/17 1136 by Lyndle Herrlich, MD   Sign clinical note

## 2017-06-21 DIAGNOSIS — M25649 Stiffness of unspecified hand, not elsewhere classified: Secondary | ICD-10-CM | POA: Diagnosis not present

## 2017-06-21 DIAGNOSIS — S62624A Displaced fracture of medial phalanx of right ring finger, initial encounter for closed fracture: Secondary | ICD-10-CM | POA: Diagnosis not present

## 2017-06-21 DIAGNOSIS — S62624D Displaced fracture of medial phalanx of right ring finger, subsequent encounter for fracture with routine healing: Secondary | ICD-10-CM | POA: Diagnosis not present

## 2017-06-21 DIAGNOSIS — M79644 Pain in right finger(s): Secondary | ICD-10-CM | POA: Diagnosis not present

## 2017-07-07 DIAGNOSIS — S62624D Displaced fracture of medial phalanx of right ring finger, subsequent encounter for fracture with routine healing: Secondary | ICD-10-CM | POA: Diagnosis not present

## 2017-07-07 DIAGNOSIS — M25649 Stiffness of unspecified hand, not elsewhere classified: Secondary | ICD-10-CM | POA: Diagnosis not present

## 2017-09-29 DIAGNOSIS — M25641 Stiffness of right hand, not elsewhere classified: Secondary | ICD-10-CM | POA: Diagnosis not present

## 2017-09-29 DIAGNOSIS — M25541 Pain in joints of right hand: Secondary | ICD-10-CM | POA: Diagnosis not present

## 2017-09-29 DIAGNOSIS — M25441 Effusion, right hand: Secondary | ICD-10-CM | POA: Diagnosis not present

## 2017-09-29 DIAGNOSIS — S62624D Displaced fracture of medial phalanx of right ring finger, subsequent encounter for fracture with routine healing: Secondary | ICD-10-CM | POA: Diagnosis not present

## 2017-10-02 DIAGNOSIS — M25541 Pain in joints of right hand: Secondary | ICD-10-CM | POA: Diagnosis not present

## 2017-10-02 DIAGNOSIS — M25441 Effusion, right hand: Secondary | ICD-10-CM | POA: Diagnosis not present

## 2017-10-02 DIAGNOSIS — M25641 Stiffness of right hand, not elsewhere classified: Secondary | ICD-10-CM | POA: Diagnosis not present

## 2017-10-02 DIAGNOSIS — S62624D Displaced fracture of medial phalanx of right ring finger, subsequent encounter for fracture with routine healing: Secondary | ICD-10-CM | POA: Diagnosis not present

## 2017-10-06 DIAGNOSIS — M25441 Effusion, right hand: Secondary | ICD-10-CM | POA: Diagnosis not present

## 2017-10-06 DIAGNOSIS — M25541 Pain in joints of right hand: Secondary | ICD-10-CM | POA: Diagnosis not present

## 2017-10-06 DIAGNOSIS — M25641 Stiffness of right hand, not elsewhere classified: Secondary | ICD-10-CM | POA: Diagnosis not present

## 2017-10-06 DIAGNOSIS — S62624D Displaced fracture of medial phalanx of right ring finger, subsequent encounter for fracture with routine healing: Secondary | ICD-10-CM | POA: Diagnosis not present

## 2017-10-10 DIAGNOSIS — M25641 Stiffness of right hand, not elsewhere classified: Secondary | ICD-10-CM | POA: Diagnosis not present

## 2017-10-10 DIAGNOSIS — S62624D Displaced fracture of medial phalanx of right ring finger, subsequent encounter for fracture with routine healing: Secondary | ICD-10-CM | POA: Diagnosis not present

## 2017-10-10 DIAGNOSIS — M25541 Pain in joints of right hand: Secondary | ICD-10-CM | POA: Diagnosis not present

## 2017-10-10 DIAGNOSIS — M25441 Effusion, right hand: Secondary | ICD-10-CM | POA: Diagnosis not present

## 2017-10-11 DIAGNOSIS — E041 Nontoxic single thyroid nodule: Secondary | ICD-10-CM | POA: Diagnosis not present

## 2017-10-11 DIAGNOSIS — H9312 Tinnitus, left ear: Secondary | ICD-10-CM | POA: Diagnosis not present

## 2017-10-11 DIAGNOSIS — H903 Sensorineural hearing loss, bilateral: Secondary | ICD-10-CM | POA: Diagnosis not present

## 2017-10-12 DIAGNOSIS — M25641 Stiffness of right hand, not elsewhere classified: Secondary | ICD-10-CM | POA: Diagnosis not present

## 2017-10-12 DIAGNOSIS — S62624D Displaced fracture of medial phalanx of right ring finger, subsequent encounter for fracture with routine healing: Secondary | ICD-10-CM | POA: Diagnosis not present

## 2017-10-12 DIAGNOSIS — M25541 Pain in joints of right hand: Secondary | ICD-10-CM | POA: Diagnosis not present

## 2017-10-12 DIAGNOSIS — M25441 Effusion, right hand: Secondary | ICD-10-CM | POA: Diagnosis not present

## 2017-10-18 DIAGNOSIS — M25641 Stiffness of right hand, not elsewhere classified: Secondary | ICD-10-CM | POA: Diagnosis not present

## 2017-10-18 DIAGNOSIS — M25541 Pain in joints of right hand: Secondary | ICD-10-CM | POA: Diagnosis not present

## 2017-10-18 DIAGNOSIS — M25441 Effusion, right hand: Secondary | ICD-10-CM | POA: Diagnosis not present

## 2017-10-18 DIAGNOSIS — S62624D Displaced fracture of medial phalanx of right ring finger, subsequent encounter for fracture with routine healing: Secondary | ICD-10-CM | POA: Diagnosis not present

## 2017-10-19 DIAGNOSIS — M25641 Stiffness of right hand, not elsewhere classified: Secondary | ICD-10-CM | POA: Diagnosis not present

## 2017-10-19 DIAGNOSIS — S62624D Displaced fracture of medial phalanx of right ring finger, subsequent encounter for fracture with routine healing: Secondary | ICD-10-CM | POA: Diagnosis not present

## 2017-10-19 DIAGNOSIS — M25441 Effusion, right hand: Secondary | ICD-10-CM | POA: Diagnosis not present

## 2017-10-19 DIAGNOSIS — M25541 Pain in joints of right hand: Secondary | ICD-10-CM | POA: Diagnosis not present

## 2017-10-24 DIAGNOSIS — M25441 Effusion, right hand: Secondary | ICD-10-CM | POA: Diagnosis not present

## 2017-10-24 DIAGNOSIS — M25541 Pain in joints of right hand: Secondary | ICD-10-CM | POA: Diagnosis not present

## 2017-10-24 DIAGNOSIS — M25641 Stiffness of right hand, not elsewhere classified: Secondary | ICD-10-CM | POA: Diagnosis not present

## 2017-10-24 DIAGNOSIS — S62624D Displaced fracture of medial phalanx of right ring finger, subsequent encounter for fracture with routine healing: Secondary | ICD-10-CM | POA: Diagnosis not present

## 2017-10-31 DIAGNOSIS — M25541 Pain in joints of right hand: Secondary | ICD-10-CM | POA: Diagnosis not present

## 2017-10-31 DIAGNOSIS — M25441 Effusion, right hand: Secondary | ICD-10-CM | POA: Diagnosis not present

## 2017-10-31 DIAGNOSIS — S62624D Displaced fracture of medial phalanx of right ring finger, subsequent encounter for fracture with routine healing: Secondary | ICD-10-CM | POA: Diagnosis not present

## 2017-10-31 DIAGNOSIS — M25641 Stiffness of right hand, not elsewhere classified: Secondary | ICD-10-CM | POA: Diagnosis not present

## 2017-11-02 DIAGNOSIS — M25641 Stiffness of right hand, not elsewhere classified: Secondary | ICD-10-CM | POA: Diagnosis not present

## 2017-11-02 DIAGNOSIS — S62624D Displaced fracture of medial phalanx of right ring finger, subsequent encounter for fracture with routine healing: Secondary | ICD-10-CM | POA: Diagnosis not present

## 2017-11-02 DIAGNOSIS — M25541 Pain in joints of right hand: Secondary | ICD-10-CM | POA: Diagnosis not present

## 2017-11-02 DIAGNOSIS — M25441 Effusion, right hand: Secondary | ICD-10-CM | POA: Diagnosis not present

## 2017-11-07 DIAGNOSIS — S62624D Displaced fracture of medial phalanx of right ring finger, subsequent encounter for fracture with routine healing: Secondary | ICD-10-CM | POA: Diagnosis not present

## 2017-11-07 DIAGNOSIS — M25641 Stiffness of right hand, not elsewhere classified: Secondary | ICD-10-CM | POA: Diagnosis not present

## 2017-11-07 DIAGNOSIS — M25541 Pain in joints of right hand: Secondary | ICD-10-CM | POA: Diagnosis not present

## 2017-11-07 DIAGNOSIS — M25441 Effusion, right hand: Secondary | ICD-10-CM | POA: Diagnosis not present

## 2017-11-09 DIAGNOSIS — M25441 Effusion, right hand: Secondary | ICD-10-CM | POA: Diagnosis not present

## 2017-11-09 DIAGNOSIS — S62624D Displaced fracture of medial phalanx of right ring finger, subsequent encounter for fracture with routine healing: Secondary | ICD-10-CM | POA: Diagnosis not present

## 2017-11-09 DIAGNOSIS — M25541 Pain in joints of right hand: Secondary | ICD-10-CM | POA: Diagnosis not present

## 2017-11-09 DIAGNOSIS — M25641 Stiffness of right hand, not elsewhere classified: Secondary | ICD-10-CM | POA: Diagnosis not present

## 2017-11-15 DIAGNOSIS — M25641 Stiffness of right hand, not elsewhere classified: Secondary | ICD-10-CM | POA: Diagnosis not present

## 2017-11-15 DIAGNOSIS — S62624D Displaced fracture of medial phalanx of right ring finger, subsequent encounter for fracture with routine healing: Secondary | ICD-10-CM | POA: Diagnosis not present

## 2017-11-15 DIAGNOSIS — M25541 Pain in joints of right hand: Secondary | ICD-10-CM | POA: Diagnosis not present

## 2017-11-15 DIAGNOSIS — M25441 Effusion, right hand: Secondary | ICD-10-CM | POA: Diagnosis not present

## 2017-11-16 DIAGNOSIS — N529 Male erectile dysfunction, unspecified: Secondary | ICD-10-CM | POA: Diagnosis not present

## 2017-11-16 DIAGNOSIS — Z1331 Encounter for screening for depression: Secondary | ICD-10-CM | POA: Diagnosis not present

## 2017-11-16 DIAGNOSIS — Z131 Encounter for screening for diabetes mellitus: Secondary | ICD-10-CM | POA: Diagnosis not present

## 2017-11-16 DIAGNOSIS — G4700 Insomnia, unspecified: Secondary | ICD-10-CM | POA: Diagnosis not present

## 2017-11-16 DIAGNOSIS — Z1211 Encounter for screening for malignant neoplasm of colon: Secondary | ICD-10-CM | POA: Diagnosis not present

## 2017-11-16 DIAGNOSIS — E785 Hyperlipidemia, unspecified: Secondary | ICD-10-CM | POA: Diagnosis not present

## 2017-11-16 DIAGNOSIS — Z125 Encounter for screening for malignant neoplasm of prostate: Secondary | ICD-10-CM | POA: Diagnosis not present

## 2017-11-16 DIAGNOSIS — I10 Essential (primary) hypertension: Secondary | ICD-10-CM | POA: Diagnosis not present

## 2017-11-16 DIAGNOSIS — Z79899 Other long term (current) drug therapy: Secondary | ICD-10-CM | POA: Diagnosis not present

## 2017-11-16 DIAGNOSIS — Z9181 History of falling: Secondary | ICD-10-CM | POA: Diagnosis not present

## 2017-11-29 DIAGNOSIS — Z0000 Encounter for general adult medical examination without abnormal findings: Secondary | ICD-10-CM | POA: Diagnosis not present

## 2017-11-29 DIAGNOSIS — E785 Hyperlipidemia, unspecified: Secondary | ICD-10-CM | POA: Diagnosis not present

## 2017-11-29 DIAGNOSIS — Z136 Encounter for screening for cardiovascular disorders: Secondary | ICD-10-CM | POA: Diagnosis not present

## 2017-11-29 DIAGNOSIS — Z1331 Encounter for screening for depression: Secondary | ICD-10-CM | POA: Diagnosis not present

## 2018-01-08 DIAGNOSIS — J22 Unspecified acute lower respiratory infection: Secondary | ICD-10-CM | POA: Diagnosis not present

## 2018-01-08 DIAGNOSIS — J101 Influenza due to other identified influenza virus with other respiratory manifestations: Secondary | ICD-10-CM | POA: Diagnosis not present

## 2018-01-16 DIAGNOSIS — S62624S Displaced fracture of medial phalanx of right ring finger, sequela: Secondary | ICD-10-CM | POA: Diagnosis not present

## 2018-01-16 DIAGNOSIS — M24541 Contracture, right hand: Secondary | ICD-10-CM | POA: Diagnosis not present

## 2018-01-19 DIAGNOSIS — Z86010 Personal history of colonic polyps: Secondary | ICD-10-CM | POA: Diagnosis not present

## 2018-01-19 DIAGNOSIS — Z1211 Encounter for screening for malignant neoplasm of colon: Secondary | ICD-10-CM | POA: Diagnosis not present

## 2018-04-16 DIAGNOSIS — Z1331 Encounter for screening for depression: Secondary | ICD-10-CM | POA: Diagnosis not present

## 2018-04-16 DIAGNOSIS — Z6820 Body mass index (BMI) 20.0-20.9, adult: Secondary | ICD-10-CM | POA: Diagnosis not present

## 2018-04-16 DIAGNOSIS — Z9181 History of falling: Secondary | ICD-10-CM | POA: Diagnosis not present

## 2018-04-16 DIAGNOSIS — G4700 Insomnia, unspecified: Secondary | ICD-10-CM | POA: Diagnosis not present

## 2018-04-16 DIAGNOSIS — Z79899 Other long term (current) drug therapy: Secondary | ICD-10-CM | POA: Diagnosis not present

## 2018-05-01 DIAGNOSIS — L578 Other skin changes due to chronic exposure to nonionizing radiation: Secondary | ICD-10-CM | POA: Diagnosis not present

## 2018-05-01 DIAGNOSIS — L570 Actinic keratosis: Secondary | ICD-10-CM | POA: Diagnosis not present

## 2018-06-28 DIAGNOSIS — R030 Elevated blood-pressure reading, without diagnosis of hypertension: Secondary | ICD-10-CM | POA: Diagnosis not present

## 2018-06-28 DIAGNOSIS — E785 Hyperlipidemia, unspecified: Secondary | ICD-10-CM | POA: Diagnosis not present

## 2018-06-28 DIAGNOSIS — G4700 Insomnia, unspecified: Secondary | ICD-10-CM | POA: Diagnosis not present

## 2018-11-27 DIAGNOSIS — R030 Elevated blood-pressure reading, without diagnosis of hypertension: Secondary | ICD-10-CM | POA: Diagnosis not present

## 2018-11-27 DIAGNOSIS — Z125 Encounter for screening for malignant neoplasm of prostate: Secondary | ICD-10-CM | POA: Diagnosis not present

## 2018-11-27 DIAGNOSIS — E785 Hyperlipidemia, unspecified: Secondary | ICD-10-CM | POA: Diagnosis not present

## 2018-11-27 DIAGNOSIS — N529 Male erectile dysfunction, unspecified: Secondary | ICD-10-CM | POA: Diagnosis not present

## 2018-11-27 DIAGNOSIS — G4700 Insomnia, unspecified: Secondary | ICD-10-CM | POA: Diagnosis not present

## 2019-02-27 DIAGNOSIS — G4700 Insomnia, unspecified: Secondary | ICD-10-CM | POA: Diagnosis not present

## 2019-02-27 DIAGNOSIS — E785 Hyperlipidemia, unspecified: Secondary | ICD-10-CM | POA: Diagnosis not present

## 2019-02-27 DIAGNOSIS — I10 Essential (primary) hypertension: Secondary | ICD-10-CM | POA: Diagnosis not present

## 2019-05-07 DIAGNOSIS — L821 Other seborrheic keratosis: Secondary | ICD-10-CM | POA: Diagnosis not present

## 2019-05-07 DIAGNOSIS — L570 Actinic keratosis: Secondary | ICD-10-CM | POA: Diagnosis not present

## 2019-05-07 DIAGNOSIS — L814 Other melanin hyperpigmentation: Secondary | ICD-10-CM | POA: Diagnosis not present

## 2019-05-15 DIAGNOSIS — H47233 Glaucomatous optic atrophy, bilateral: Secondary | ICD-10-CM | POA: Diagnosis not present

## 2019-05-15 DIAGNOSIS — H40013 Open angle with borderline findings, low risk, bilateral: Secondary | ICD-10-CM | POA: Diagnosis not present

## 2019-05-30 DIAGNOSIS — I10 Essential (primary) hypertension: Secondary | ICD-10-CM | POA: Diagnosis not present

## 2019-05-30 DIAGNOSIS — Z1331 Encounter for screening for depression: Secondary | ICD-10-CM | POA: Diagnosis not present

## 2019-05-30 DIAGNOSIS — E785 Hyperlipidemia, unspecified: Secondary | ICD-10-CM | POA: Diagnosis not present

## 2019-05-30 DIAGNOSIS — G4700 Insomnia, unspecified: Secondary | ICD-10-CM | POA: Diagnosis not present

## 2019-05-30 DIAGNOSIS — Z9181 History of falling: Secondary | ICD-10-CM | POA: Diagnosis not present

## 2019-07-18 DIAGNOSIS — Z139 Encounter for screening, unspecified: Secondary | ICD-10-CM | POA: Diagnosis not present

## 2019-07-18 DIAGNOSIS — Z1331 Encounter for screening for depression: Secondary | ICD-10-CM | POA: Diagnosis not present

## 2019-07-18 DIAGNOSIS — Z136 Encounter for screening for cardiovascular disorders: Secondary | ICD-10-CM | POA: Diagnosis not present

## 2019-07-18 DIAGNOSIS — Z125 Encounter for screening for malignant neoplasm of prostate: Secondary | ICD-10-CM | POA: Diagnosis not present

## 2019-07-18 DIAGNOSIS — E785 Hyperlipidemia, unspecified: Secondary | ICD-10-CM | POA: Diagnosis not present

## 2019-07-18 DIAGNOSIS — Z0000 Encounter for general adult medical examination without abnormal findings: Secondary | ICD-10-CM | POA: Diagnosis not present

## 2019-07-18 DIAGNOSIS — Z9181 History of falling: Secondary | ICD-10-CM | POA: Diagnosis not present

## 2020-02-06 DIAGNOSIS — H47233 Glaucomatous optic atrophy, bilateral: Secondary | ICD-10-CM | POA: Diagnosis not present

## 2020-02-06 DIAGNOSIS — H5203 Hypermetropia, bilateral: Secondary | ICD-10-CM | POA: Diagnosis not present

## 2020-02-06 DIAGNOSIS — H40009 Preglaucoma, unspecified, unspecified eye: Secondary | ICD-10-CM | POA: Diagnosis not present

## 2020-02-06 DIAGNOSIS — H25813 Combined forms of age-related cataract, bilateral: Secondary | ICD-10-CM | POA: Diagnosis not present

## 2020-02-06 DIAGNOSIS — H524 Presbyopia: Secondary | ICD-10-CM | POA: Diagnosis not present

## 2020-02-06 DIAGNOSIS — H52223 Regular astigmatism, bilateral: Secondary | ICD-10-CM | POA: Diagnosis not present

## 2020-02-06 DIAGNOSIS — H40013 Open angle with borderline findings, low risk, bilateral: Secondary | ICD-10-CM | POA: Diagnosis not present

## 2020-05-01 DIAGNOSIS — S81832A Puncture wound without foreign body, left lower leg, initial encounter: Secondary | ICD-10-CM

## 2020-05-01 DIAGNOSIS — Z23 Encounter for immunization: Secondary | ICD-10-CM | POA: Diagnosis not present

## 2020-05-01 DIAGNOSIS — S8992XA Unspecified injury of left lower leg, initial encounter: Secondary | ICD-10-CM

## 2020-05-01 DIAGNOSIS — L0390 Cellulitis, unspecified: Secondary | ICD-10-CM | POA: Insufficient documentation

## 2020-05-01 HISTORY — DX: Puncture wound without foreign body, left lower leg, initial encounter: S81.832A

## 2020-05-01 HISTORY — DX: Cellulitis, unspecified: L03.90

## 2020-05-01 HISTORY — DX: Unspecified injury of left lower leg, initial encounter: S89.92XA

## 2020-05-06 DIAGNOSIS — M79641 Pain in right hand: Secondary | ICD-10-CM | POA: Diagnosis not present

## 2020-05-06 DIAGNOSIS — S81812A Laceration without foreign body, left lower leg, initial encounter: Secondary | ICD-10-CM | POA: Diagnosis not present

## 2020-05-06 DIAGNOSIS — M2460 Ankylosis, unspecified joint: Secondary | ICD-10-CM | POA: Diagnosis not present

## 2020-05-12 DIAGNOSIS — S81802A Unspecified open wound, left lower leg, initial encounter: Secondary | ICD-10-CM | POA: Diagnosis not present

## 2020-05-12 DIAGNOSIS — M79605 Pain in left leg: Secondary | ICD-10-CM | POA: Diagnosis not present

## 2020-07-16 DIAGNOSIS — D225 Melanocytic nevi of trunk: Secondary | ICD-10-CM | POA: Diagnosis not present

## 2020-07-16 DIAGNOSIS — L821 Other seborrheic keratosis: Secondary | ICD-10-CM | POA: Diagnosis not present

## 2020-07-16 DIAGNOSIS — L570 Actinic keratosis: Secondary | ICD-10-CM | POA: Diagnosis not present

## 2020-07-16 DIAGNOSIS — D1801 Hemangioma of skin and subcutaneous tissue: Secondary | ICD-10-CM | POA: Diagnosis not present

## 2020-07-16 DIAGNOSIS — D2239 Melanocytic nevi of other parts of face: Secondary | ICD-10-CM | POA: Diagnosis not present

## 2020-07-30 DIAGNOSIS — Z0000 Encounter for general adult medical examination without abnormal findings: Secondary | ICD-10-CM | POA: Diagnosis not present

## 2020-07-30 DIAGNOSIS — E785 Hyperlipidemia, unspecified: Secondary | ICD-10-CM | POA: Diagnosis not present

## 2020-07-30 DIAGNOSIS — Z9181 History of falling: Secondary | ICD-10-CM | POA: Diagnosis not present

## 2020-10-06 DIAGNOSIS — Z125 Encounter for screening for malignant neoplasm of prostate: Secondary | ICD-10-CM | POA: Diagnosis not present

## 2020-10-06 DIAGNOSIS — I10 Essential (primary) hypertension: Secondary | ICD-10-CM | POA: Diagnosis not present

## 2020-10-06 DIAGNOSIS — G4700 Insomnia, unspecified: Secondary | ICD-10-CM | POA: Diagnosis not present

## 2020-10-06 DIAGNOSIS — E785 Hyperlipidemia, unspecified: Secondary | ICD-10-CM | POA: Diagnosis not present

## 2020-10-06 DIAGNOSIS — Z79899 Other long term (current) drug therapy: Secondary | ICD-10-CM | POA: Diagnosis not present

## 2020-12-02 DIAGNOSIS — M79641 Pain in right hand: Secondary | ICD-10-CM | POA: Diagnosis not present

## 2021-01-19 DIAGNOSIS — N529 Male erectile dysfunction, unspecified: Secondary | ICD-10-CM | POA: Diagnosis not present

## 2021-01-19 DIAGNOSIS — G4700 Insomnia, unspecified: Secondary | ICD-10-CM | POA: Diagnosis not present

## 2021-01-19 DIAGNOSIS — Z125 Encounter for screening for malignant neoplasm of prostate: Secondary | ICD-10-CM | POA: Diagnosis not present

## 2021-01-19 DIAGNOSIS — Z6820 Body mass index (BMI) 20.0-20.9, adult: Secondary | ICD-10-CM | POA: Diagnosis not present

## 2021-01-19 DIAGNOSIS — Z79899 Other long term (current) drug therapy: Secondary | ICD-10-CM | POA: Diagnosis not present

## 2021-01-19 DIAGNOSIS — E785 Hyperlipidemia, unspecified: Secondary | ICD-10-CM | POA: Diagnosis not present

## 2021-01-19 DIAGNOSIS — L259 Unspecified contact dermatitis, unspecified cause: Secondary | ICD-10-CM | POA: Diagnosis not present

## 2021-01-19 DIAGNOSIS — I10 Essential (primary) hypertension: Secondary | ICD-10-CM | POA: Diagnosis not present

## 2021-02-08 DIAGNOSIS — H5201 Hypermetropia, right eye: Secondary | ICD-10-CM | POA: Diagnosis not present

## 2021-02-08 DIAGNOSIS — H47233 Glaucomatous optic atrophy, bilateral: Secondary | ICD-10-CM | POA: Diagnosis not present

## 2021-02-08 DIAGNOSIS — H40013 Open angle with borderline findings, low risk, bilateral: Secondary | ICD-10-CM | POA: Diagnosis not present

## 2021-02-08 DIAGNOSIS — H524 Presbyopia: Secondary | ICD-10-CM | POA: Diagnosis not present

## 2021-02-08 DIAGNOSIS — H52221 Regular astigmatism, right eye: Secondary | ICD-10-CM | POA: Diagnosis not present

## 2021-02-08 DIAGNOSIS — H25813 Combined forms of age-related cataract, bilateral: Secondary | ICD-10-CM | POA: Diagnosis not present

## 2021-05-24 ENCOUNTER — Ambulatory Visit (INDEPENDENT_AMBULATORY_CARE_PROVIDER_SITE_OTHER): Payer: Medicare Other

## 2021-05-24 ENCOUNTER — Other Ambulatory Visit: Payer: Self-pay

## 2021-05-24 ENCOUNTER — Ambulatory Visit (INDEPENDENT_AMBULATORY_CARE_PROVIDER_SITE_OTHER): Payer: Medicare Other | Admitting: Podiatry

## 2021-05-24 DIAGNOSIS — B351 Tinea unguium: Secondary | ICD-10-CM | POA: Diagnosis not present

## 2021-05-24 DIAGNOSIS — M7751 Other enthesopathy of right foot: Secondary | ICD-10-CM | POA: Diagnosis not present

## 2021-05-24 DIAGNOSIS — M79671 Pain in right foot: Secondary | ICD-10-CM

## 2021-05-24 DIAGNOSIS — M7752 Other enthesopathy of left foot: Secondary | ICD-10-CM | POA: Diagnosis not present

## 2021-05-24 DIAGNOSIS — M79676 Pain in unspecified toe(s): Secondary | ICD-10-CM | POA: Diagnosis not present

## 2021-05-24 NOTE — Progress Notes (Signed)
  Subjective:  Patient ID: Nicholas Wong, male    DOB: [birth year],  MRN: [id number]  Chief Complaint  Patient presents with   Nail Problem    Rt hallux toenail discoloration    Pain    Pain and with lesion formation at [place] 5th sub met   70 y.o. male presents with the above complaint. History confirmed with patient.  Objective:  Physical Exam: warm, good capillary refill, no trophic changes or ulcerative lesions, normal DP and PT pulses, and normal sensory exam. Hammertoes bilat. Forefoot fat pad atrophy with 5th met head HPKs bilat. Right hallux nail thickened with powdery white residue on medial and lateral borders of the nail.  No images are attached to the encounter.  Radiographs: X-ray of both feet: no fracture, dislocation, swelling or degenerative changes noted Assessment:   1. Capsulitis of metatarsophalangeal (MTP) joints of both feet   2. Onychomycosis   3. Pain around toenail    Plan:  Patient was evaluated and treated and all questions answered.  Onychomycosis - ? Yeast origin -Educated on etiology of nail fungus. -Nail sample taken for microbiology and histology.  -eRx for fluconazole weekly.  Capsulitis -Educated on etiology, padding -Dispensed metatarsal sling pads. -Recc OTC inserts  Return in about 4 weeks (around 06/21/2021) for Nail Fungus.

## 2021-05-24 NOTE — Addendum Note (Signed)
Addended by: Allean Found on: 05/24/2021 02:55 PM   Modules accepted: Orders

## 2021-05-28 ENCOUNTER — Other Ambulatory Visit: Payer: Self-pay | Admitting: Podiatry

## 2021-05-28 DIAGNOSIS — M7751 Other enthesopathy of right foot: Secondary | ICD-10-CM

## 2021-05-28 DIAGNOSIS — M7752 Other enthesopathy of left foot: Secondary | ICD-10-CM

## 2021-06-02 ENCOUNTER — Telehealth: Payer: Self-pay

## 2021-06-02 NOTE — Telephone Encounter (Signed)
Pt called and LVM stating he was seen on 6/20 and he still has not received the Rx that was Rx'ed. Pt states if he can get the Rx sent to his pharmacy.

## 2021-06-03 DIAGNOSIS — D225 Melanocytic nevi of trunk: Secondary | ICD-10-CM | POA: Diagnosis not present

## 2021-06-03 DIAGNOSIS — D2239 Melanocytic nevi of other parts of face: Secondary | ICD-10-CM | POA: Diagnosis not present

## 2021-06-03 DIAGNOSIS — L570 Actinic keratosis: Secondary | ICD-10-CM | POA: Diagnosis not present

## 2021-06-03 DIAGNOSIS — L821 Other seborrheic keratosis: Secondary | ICD-10-CM | POA: Diagnosis not present

## 2021-06-03 DIAGNOSIS — D485 Neoplasm of uncertain behavior of skin: Secondary | ICD-10-CM | POA: Diagnosis not present

## 2021-06-03 DIAGNOSIS — D1801 Hemangioma of skin and subcutaneous tissue: Secondary | ICD-10-CM | POA: Diagnosis not present

## 2021-06-04 ENCOUNTER — Telehealth: Payer: Self-pay | Admitting: *Deleted

## 2021-06-04 NOTE — Telephone Encounter (Signed)
I called and spoke with the patient and relayed the message per Dr March Rummage. Lattie Haw

## 2021-06-04 NOTE — Telephone Encounter (Signed)
-----   Message from Evelina Bucy, DPM sent at 06/04/2021  9:48 AM EDT ----- Please let him know it takes a while and the results aren't back yet ----- Message ----- From: Viviana Simpler, PMAC Sent: 06/03/2021   5:00 PM EDT To: Evelina Bucy, DPM  Patient had lab work done and is wanting to see what the results are and have someone call patient back. Lattie Haw

## 2021-06-08 MED ORDER — FLUCONAZOLE 150 MG PO TABS: 150.0000 mg | ORAL_TABLET | ORAL | 2 refills | Status: DC

## 2021-06-08 NOTE — Telephone Encounter (Signed)
Can you advise him to check the pharmacy - med should be there

## 2021-06-08 NOTE — Addendum Note (Signed)
Addended by: Hardie Pulley on: 06/08/2021 08:40 AM   Modules accepted: Orders

## 2021-06-08 NOTE — Telephone Encounter (Signed)
Pt was advised that Rx was sent today to his pharmacy and it should be ready to pick up. Pt stated understanding

## 2021-06-21 ENCOUNTER — Other Ambulatory Visit: Payer: Self-pay

## 2021-06-21 ENCOUNTER — Ambulatory Visit (INDEPENDENT_AMBULATORY_CARE_PROVIDER_SITE_OTHER): Payer: Medicare Other | Admitting: Podiatry

## 2021-06-21 DIAGNOSIS — M7751 Other enthesopathy of right foot: Secondary | ICD-10-CM

## 2021-06-21 DIAGNOSIS — M7752 Other enthesopathy of left foot: Secondary | ICD-10-CM | POA: Diagnosis not present

## 2021-06-21 DIAGNOSIS — B351 Tinea unguium: Secondary | ICD-10-CM | POA: Diagnosis not present

## 2021-06-21 MED ORDER — CICLOPIROX 8 % EX SOLN: Freq: Every day | CUTANEOUS | 0 refills | Status: DC

## 2021-06-21 NOTE — Progress Notes (Signed)
  Subjective:  Patient ID: Nicholas Wong, male    DOB: 1951/06/27,  MRN: [id number]  Chief Complaint  Patient presents with   Nail Problem    F/U Rt 1st nail fungus -pt states," looks a little worse, it's spreading.," - no apin tx: fluconazole   capsulitis    F/U Rt cap -pt states," it's improved." -3/10 sorness Tx: met pads   70 y.o. male presents with the above complaint. History confirmed with patient.  Objective:  Physical Exam: warm, good capillary refill, no trophic changes or ulcerative lesions, normal DP and PT pulses, and normal sensory exam. Hammertoes bilat. Forefoot fat pad atrophy with 5th met head HPKs bilat. Right hallux nail thickened with powdery white residue on medial and lateral borders of the nail. Assessment:   1. Onychomycosis   2. Capsulitis of metatarsophalangeal (MTP) joints of both feet     Plan:  Patient was evaluated and treated and all questions answered.  Onychomycosis  -Nail debrided of superficial fungus -Start Penlac -Continue fluconazole - has only taken 2 pills. -Reviewed nail cx with patient suggestive of nail fungus with chronic microtrauma -F/u in 1 month for recheck.  Return in about 1 month (around 07/22/2021) for Nail Fungus.

## 2021-07-07 DIAGNOSIS — C44329 Squamous cell carcinoma of skin of other parts of face: Secondary | ICD-10-CM | POA: Diagnosis not present

## 2021-07-22 ENCOUNTER — Ambulatory Visit: Payer: Medicare Other | Admitting: Podiatry

## 2021-07-29 DIAGNOSIS — I10 Essential (primary) hypertension: Secondary | ICD-10-CM | POA: Diagnosis not present

## 2021-07-29 DIAGNOSIS — N529 Male erectile dysfunction, unspecified: Secondary | ICD-10-CM | POA: Diagnosis not present

## 2021-07-29 DIAGNOSIS — Z6820 Body mass index (BMI) 20.0-20.9, adult: Secondary | ICD-10-CM | POA: Diagnosis not present

## 2021-07-29 DIAGNOSIS — G4700 Insomnia, unspecified: Secondary | ICD-10-CM | POA: Diagnosis not present

## 2021-07-29 DIAGNOSIS — E785 Hyperlipidemia, unspecified: Secondary | ICD-10-CM | POA: Diagnosis not present

## 2021-08-10 DIAGNOSIS — Z20822 Contact with and (suspected) exposure to covid-19: Secondary | ICD-10-CM | POA: Diagnosis not present

## 2021-09-09 DIAGNOSIS — Z9181 History of falling: Secondary | ICD-10-CM | POA: Diagnosis not present

## 2021-09-09 DIAGNOSIS — Z0000 Encounter for general adult medical examination without abnormal findings: Secondary | ICD-10-CM | POA: Diagnosis not present

## 2021-09-09 DIAGNOSIS — Z139 Encounter for screening, unspecified: Secondary | ICD-10-CM | POA: Diagnosis not present

## 2021-09-09 DIAGNOSIS — Z1331 Encounter for screening for depression: Secondary | ICD-10-CM | POA: Diagnosis not present

## 2021-09-09 DIAGNOSIS — E785 Hyperlipidemia, unspecified: Secondary | ICD-10-CM | POA: Diagnosis not present

## 2021-09-15 DIAGNOSIS — H2513 Age-related nuclear cataract, bilateral: Secondary | ICD-10-CM | POA: Diagnosis not present

## 2022-02-01 DIAGNOSIS — Z6820 Body mass index (BMI) 20.0-20.9, adult: Secondary | ICD-10-CM | POA: Diagnosis not present

## 2022-02-01 DIAGNOSIS — E785 Hyperlipidemia, unspecified: Secondary | ICD-10-CM | POA: Diagnosis not present

## 2022-02-01 DIAGNOSIS — G4700 Insomnia, unspecified: Secondary | ICD-10-CM | POA: Diagnosis not present

## 2022-02-01 DIAGNOSIS — N529 Male erectile dysfunction, unspecified: Secondary | ICD-10-CM | POA: Diagnosis not present

## 2022-02-01 DIAGNOSIS — Z125 Encounter for screening for malignant neoplasm of prostate: Secondary | ICD-10-CM | POA: Diagnosis not present

## 2022-02-01 DIAGNOSIS — I10 Essential (primary) hypertension: Secondary | ICD-10-CM | POA: Diagnosis not present

## 2022-03-26 DIAGNOSIS — Z20822 Contact with and (suspected) exposure to covid-19: Secondary | ICD-10-CM | POA: Diagnosis not present

## 2022-04-12 DIAGNOSIS — H40003 Preglaucoma, unspecified, bilateral: Secondary | ICD-10-CM | POA: Diagnosis not present

## 2022-04-12 DIAGNOSIS — H524 Presbyopia: Secondary | ICD-10-CM | POA: Diagnosis not present

## 2022-04-12 DIAGNOSIS — H401132 Primary open-angle glaucoma, bilateral, moderate stage: Secondary | ICD-10-CM | POA: Diagnosis not present

## 2022-04-12 DIAGNOSIS — H5201 Hypermetropia, right eye: Secondary | ICD-10-CM | POA: Diagnosis not present

## 2022-04-12 DIAGNOSIS — H47233 Glaucomatous optic atrophy, bilateral: Secondary | ICD-10-CM | POA: Diagnosis not present

## 2022-04-12 DIAGNOSIS — H25813 Combined forms of age-related cataract, bilateral: Secondary | ICD-10-CM | POA: Diagnosis not present

## 2022-04-12 DIAGNOSIS — H52223 Regular astigmatism, bilateral: Secondary | ICD-10-CM | POA: Diagnosis not present

## 2022-05-20 DIAGNOSIS — Z6820 Body mass index (BMI) 20.0-20.9, adult: Secondary | ICD-10-CM | POA: Diagnosis not present

## 2022-05-20 DIAGNOSIS — H47233 Glaucomatous optic atrophy, bilateral: Secondary | ICD-10-CM | POA: Diagnosis not present

## 2022-05-20 DIAGNOSIS — J0100 Acute maxillary sinusitis, unspecified: Secondary | ICD-10-CM | POA: Diagnosis not present

## 2022-05-20 DIAGNOSIS — H401132 Primary open-angle glaucoma, bilateral, moderate stage: Secondary | ICD-10-CM | POA: Diagnosis not present

## 2022-07-13 DIAGNOSIS — L570 Actinic keratosis: Secondary | ICD-10-CM | POA: Diagnosis not present

## 2022-07-13 DIAGNOSIS — L0889 Other specified local infections of the skin and subcutaneous tissue: Secondary | ICD-10-CM | POA: Diagnosis not present

## 2022-07-13 DIAGNOSIS — D2239 Melanocytic nevi of other parts of face: Secondary | ICD-10-CM | POA: Diagnosis not present

## 2022-07-13 DIAGNOSIS — L821 Other seborrheic keratosis: Secondary | ICD-10-CM | POA: Diagnosis not present

## 2022-07-13 DIAGNOSIS — D485 Neoplasm of uncertain behavior of skin: Secondary | ICD-10-CM | POA: Diagnosis not present

## 2022-07-13 DIAGNOSIS — D225 Melanocytic nevi of trunk: Secondary | ICD-10-CM | POA: Diagnosis not present

## 2022-07-13 DIAGNOSIS — L814 Other melanin hyperpigmentation: Secondary | ICD-10-CM | POA: Diagnosis not present

## 2022-09-21 DIAGNOSIS — H2511 Age-related nuclear cataract, right eye: Secondary | ICD-10-CM | POA: Diagnosis not present

## 2022-09-21 DIAGNOSIS — H25811 Combined forms of age-related cataract, right eye: Secondary | ICD-10-CM | POA: Diagnosis not present

## 2022-09-21 DIAGNOSIS — H2513 Age-related nuclear cataract, bilateral: Secondary | ICD-10-CM | POA: Diagnosis not present

## 2022-09-21 DIAGNOSIS — H269 Unspecified cataract: Secondary | ICD-10-CM | POA: Diagnosis not present

## 2022-09-21 DIAGNOSIS — Z01818 Encounter for other preprocedural examination: Secondary | ICD-10-CM | POA: Diagnosis not present

## 2022-09-29 DIAGNOSIS — Z6820 Body mass index (BMI) 20.0-20.9, adult: Secondary | ICD-10-CM | POA: Diagnosis not present

## 2022-09-29 DIAGNOSIS — Z9181 History of falling: Secondary | ICD-10-CM | POA: Diagnosis not present

## 2022-09-29 DIAGNOSIS — Z1331 Encounter for screening for depression: Secondary | ICD-10-CM | POA: Diagnosis not present

## 2022-09-29 DIAGNOSIS — G4700 Insomnia, unspecified: Secondary | ICD-10-CM | POA: Diagnosis not present

## 2022-09-29 DIAGNOSIS — L989 Disorder of the skin and subcutaneous tissue, unspecified: Secondary | ICD-10-CM | POA: Diagnosis not present

## 2022-09-29 DIAGNOSIS — E785 Hyperlipidemia, unspecified: Secondary | ICD-10-CM | POA: Diagnosis not present

## 2022-09-29 DIAGNOSIS — H9319 Tinnitus, unspecified ear: Secondary | ICD-10-CM | POA: Diagnosis not present

## 2022-09-29 DIAGNOSIS — I10 Essential (primary) hypertension: Secondary | ICD-10-CM | POA: Diagnosis not present

## 2022-09-29 DIAGNOSIS — Z139 Encounter for screening, unspecified: Secondary | ICD-10-CM | POA: Diagnosis not present

## 2022-10-04 DIAGNOSIS — L720 Epidermal cyst: Secondary | ICD-10-CM | POA: Diagnosis not present

## 2022-10-24 DIAGNOSIS — H2512 Age-related nuclear cataract, left eye: Secondary | ICD-10-CM | POA: Diagnosis not present

## 2022-10-24 DIAGNOSIS — H25812 Combined forms of age-related cataract, left eye: Secondary | ICD-10-CM | POA: Diagnosis not present

## 2022-10-24 DIAGNOSIS — H52202 Unspecified astigmatism, left eye: Secondary | ICD-10-CM | POA: Diagnosis not present

## 2022-10-24 DIAGNOSIS — H269 Unspecified cataract: Secondary | ICD-10-CM | POA: Diagnosis not present

## 2022-11-18 DIAGNOSIS — H401132 Primary open-angle glaucoma, bilateral, moderate stage: Secondary | ICD-10-CM | POA: Diagnosis not present

## 2022-11-18 DIAGNOSIS — H47233 Glaucomatous optic atrophy, bilateral: Secondary | ICD-10-CM | POA: Diagnosis not present

## 2022-11-18 DIAGNOSIS — H4010X2 Unspecified open-angle glaucoma, moderate stage: Secondary | ICD-10-CM | POA: Diagnosis not present

## 2022-11-24 DIAGNOSIS — S01111A Laceration without foreign body of right eyelid and periocular area, initial encounter: Secondary | ICD-10-CM | POA: Diagnosis not present

## 2022-11-29 DIAGNOSIS — J40 Bronchitis, not specified as acute or chronic: Secondary | ICD-10-CM | POA: Diagnosis not present

## 2022-11-29 DIAGNOSIS — Z4802 Encounter for removal of sutures: Secondary | ICD-10-CM | POA: Diagnosis not present

## 2023-01-04 DIAGNOSIS — L820 Inflamed seborrheic keratosis: Secondary | ICD-10-CM | POA: Diagnosis not present

## 2023-01-04 DIAGNOSIS — L02416 Cutaneous abscess of left lower limb: Secondary | ICD-10-CM | POA: Diagnosis not present

## 2023-01-04 DIAGNOSIS — R531 Weakness: Secondary | ICD-10-CM | POA: Diagnosis not present

## 2023-01-04 DIAGNOSIS — L570 Actinic keratosis: Secondary | ICD-10-CM | POA: Diagnosis not present

## 2023-02-07 DIAGNOSIS — M5416 Radiculopathy, lumbar region: Secondary | ICD-10-CM | POA: Diagnosis not present

## 2023-02-07 DIAGNOSIS — M5126 Other intervertebral disc displacement, lumbar region: Secondary | ICD-10-CM | POA: Diagnosis not present

## 2023-03-21 DIAGNOSIS — M5416 Radiculopathy, lumbar region: Secondary | ICD-10-CM | POA: Diagnosis not present

## 2023-03-21 DIAGNOSIS — M5126 Other intervertebral disc displacement, lumbar region: Secondary | ICD-10-CM | POA: Diagnosis not present

## 2023-04-04 DIAGNOSIS — N529 Male erectile dysfunction, unspecified: Secondary | ICD-10-CM | POA: Diagnosis not present

## 2023-04-04 DIAGNOSIS — I10 Essential (primary) hypertension: Secondary | ICD-10-CM | POA: Diagnosis not present

## 2023-04-04 DIAGNOSIS — Z125 Encounter for screening for malignant neoplasm of prostate: Secondary | ICD-10-CM | POA: Diagnosis not present

## 2023-04-04 DIAGNOSIS — Z6820 Body mass index (BMI) 20.0-20.9, adult: Secondary | ICD-10-CM | POA: Diagnosis not present

## 2023-04-04 DIAGNOSIS — G4700 Insomnia, unspecified: Secondary | ICD-10-CM | POA: Diagnosis not present

## 2023-04-04 DIAGNOSIS — E785 Hyperlipidemia, unspecified: Secondary | ICD-10-CM | POA: Diagnosis not present

## 2023-06-29 DIAGNOSIS — L578 Other skin changes due to chronic exposure to nonionizing radiation: Secondary | ICD-10-CM | POA: Diagnosis not present

## 2023-06-29 DIAGNOSIS — L814 Other melanin hyperpigmentation: Secondary | ICD-10-CM | POA: Diagnosis not present

## 2023-06-29 DIAGNOSIS — L820 Inflamed seborrheic keratosis: Secondary | ICD-10-CM | POA: Diagnosis not present

## 2023-06-29 DIAGNOSIS — D225 Melanocytic nevi of trunk: Secondary | ICD-10-CM | POA: Diagnosis not present

## 2023-06-29 DIAGNOSIS — L570 Actinic keratosis: Secondary | ICD-10-CM | POA: Diagnosis not present

## 2023-10-10 DIAGNOSIS — Z6820 Body mass index (BMI) 20.0-20.9, adult: Secondary | ICD-10-CM | POA: Diagnosis not present

## 2023-10-10 DIAGNOSIS — R0789 Other chest pain: Secondary | ICD-10-CM | POA: Diagnosis not present

## 2023-10-10 DIAGNOSIS — I10 Essential (primary) hypertension: Secondary | ICD-10-CM | POA: Diagnosis not present

## 2023-10-10 DIAGNOSIS — Z1331 Encounter for screening for depression: Secondary | ICD-10-CM | POA: Diagnosis not present

## 2023-10-10 DIAGNOSIS — L299 Pruritus, unspecified: Secondary | ICD-10-CM | POA: Diagnosis not present

## 2023-10-10 DIAGNOSIS — E785 Hyperlipidemia, unspecified: Secondary | ICD-10-CM | POA: Diagnosis not present

## 2023-10-10 DIAGNOSIS — Z139 Encounter for screening, unspecified: Secondary | ICD-10-CM | POA: Diagnosis not present

## 2023-10-10 DIAGNOSIS — Z9181 History of falling: Secondary | ICD-10-CM | POA: Diagnosis not present

## 2023-10-10 DIAGNOSIS — G4700 Insomnia, unspecified: Secondary | ICD-10-CM | POA: Diagnosis not present

## 2023-10-10 DIAGNOSIS — N529 Male erectile dysfunction, unspecified: Secondary | ICD-10-CM | POA: Diagnosis not present

## 2023-10-12 ENCOUNTER — Other Ambulatory Visit: Payer: Self-pay

## 2023-10-12 DIAGNOSIS — G4700 Insomnia, unspecified: Secondary | ICD-10-CM | POA: Insufficient documentation

## 2023-10-12 DIAGNOSIS — E785 Hyperlipidemia, unspecified: Secondary | ICD-10-CM | POA: Insufficient documentation

## 2023-10-12 DIAGNOSIS — Z6820 Body mass index (BMI) 20.0-20.9, adult: Secondary | ICD-10-CM | POA: Insufficient documentation

## 2023-10-12 DIAGNOSIS — Z860100 Personal history of colon polyps, unspecified: Secondary | ICD-10-CM | POA: Insufficient documentation

## 2023-10-12 DIAGNOSIS — K5790 Diverticulosis of intestine, part unspecified, without perforation or abscess without bleeding: Secondary | ICD-10-CM | POA: Insufficient documentation

## 2023-10-12 DIAGNOSIS — I10 Essential (primary) hypertension: Secondary | ICD-10-CM | POA: Insufficient documentation

## 2023-10-12 DIAGNOSIS — N529 Male erectile dysfunction, unspecified: Secondary | ICD-10-CM | POA: Insufficient documentation

## 2023-10-13 ENCOUNTER — Ambulatory Visit: Payer: Medicare Other | Attending: Cardiology | Admitting: Cardiology

## 2023-10-13 ENCOUNTER — Encounter: Payer: Self-pay | Admitting: Cardiology

## 2023-10-13 VITALS — BP 152/70 | HR 61 | Ht 67.6 in | Wt 135.2 lb

## 2023-10-13 DIAGNOSIS — R011 Cardiac murmur, unspecified: Secondary | ICD-10-CM

## 2023-10-13 DIAGNOSIS — R079 Chest pain, unspecified: Secondary | ICD-10-CM

## 2023-10-13 DIAGNOSIS — E785 Hyperlipidemia, unspecified: Secondary | ICD-10-CM

## 2023-10-13 DIAGNOSIS — I10 Essential (primary) hypertension: Secondary | ICD-10-CM | POA: Diagnosis not present

## 2023-10-13 HISTORY — DX: Cardiac murmur, unspecified: R01.1

## 2023-10-13 HISTORY — DX: Chest pain, unspecified: R07.9

## 2023-10-13 MED ORDER — NITROGLYCERIN 0.4 MG SL SUBL: 0.4000 mg | SUBLINGUAL_TABLET | SUBLINGUAL | 6 refills | Status: AC | PRN

## 2023-10-13 NOTE — Patient Instructions (Addendum)
Medication Instructions:  START: Nitroglycerin Use nitroglycerin 1 tablet placed under the tongue at the first sign of chest pain or an angina attack. 1 tablet may be used every 5 minutes as needed, for up to 15 minutes. Do not take more than 3 tablets in 15 minutes. If pain persist call 911 or go to the nearest ED.      Lab Work: None Ordered If you have labs (blood work) drawn today and your tests are completely normal, you will receive your results only by: MyChart Message (if you have MyChart) OR A paper copy in the mail If you have any lab test that is abnormal or we need to change your treatment, we will call you to review the results.   Testing/Procedures: We will order CT coronary calcium score. It will cost $99.00 and is not covered by insurance.  Please call to schedule.     [facility] [address] [place], [place] [address] [phone]    Your physician has requested that you have a lexiscan myoview. For further information please visit [email or web address] Please follow instruction sheet, as given.  The test will take approximately 3 to 4 hours to complete; you may bring reading material.  If someone comes with you to your appointment, they will need to remain in the main lobby due to limited space in the testing area.    How to prepare for your Myocardial Perfusion Test: Exercise Myoview Do not eat or drink 3 hours prior to your test, except you may have water. Do not consume products containing caffeine (regular or decaffeinated) 12 hours prior to your test. (ex: coffee, chocolate, sodas, tea). Do bring a list of your current medications with you.  If not listed below, you may take your medications as normal. Do wear comfortable clothes (no dresses or overalls) and walking shoes, tennis shoes preferred (No heels or open toe shoes are allowed). Do NOT wear cologne, perfume, aftershave, or lotions (deodorant is allowed). If these instructions are not  followed, your test will have to be rescheduled.     Follow-Up: At [facility], you and your health needs are our priority.  As part of our continuing mission to provide you with exceptional heart care, we have created designated Provider Care Teams.  These Care Teams include your primary Cardiologist (physician) and Advanced Practice Providers (APPs -  Physician Assistants and Nurse Practitioners) who all work together to provide you with the care you need, when you need it.  We recommend signing up for the patient portal called "MyChart".  Sign up information is provided on this After Visit Summary.  MyChart is used to connect with patients for Virtual Visits (Telemedicine).  Patients are able to view lab/test results, encounter notes, upcoming appointments, etc.  Non-urgent messages can be sent to your provider as well.   To learn more about what you can do with MyChart, go to [email or web address].    Your next appointment:   9 month(s)  The format for your next appointment:   In Person  Provider:   Belva Crome, MD    Other Instructions NA    Blood Pressure Record Sheet To take your blood pressure, you will need a blood pressure machine. You can buy a blood pressure machine (blood pressure monitor) at your clinic, drug store, or online. When choosing one, consider: An automatic monitor that has an arm cuff. A cuff that wraps snugly around your upper arm. You should be able to fit only  one finger between your arm and the cuff. A device that stores blood pressure reading results. Do not choose a monitor that measures your blood pressure from your wrist or finger. Follow your health care provider's instructions for how to take your blood pressure. To use this form: Get one reading in the morning (a.m.) 1-2 hours after you take any medicines. Get one reading in the evening (p.m.) before supper. Take at least 2 readings with each blood pressure check. This makes sure the  results are correct. Wait 1-2 minutes between measurements. Write down the results in the spaces on this form. Repeat this once a week, or as told by your health care provider.  Make a follow-up appointment with your health care provider to discuss the results. Blood pressure log Date: _______________________ a.m. _____________________(1st reading) HR___________            p.m. _____________________(2nd reading) HR__________  Date: _______________________ a.m. _____________________(1st reading) HR___________            p.m. _____________________(2nd reading) HR__________ Date: _______________________ a.m. _____________________(1st reading) HR___________            p.m. _____________________(2nd reading) HR__________ Date: _______________________ a.m. _____________________(1st reading) HR___________            p.m. _____________________(2nd reading) HR__________  Date: _______________________ a.m. _____________________(1st reading) HR___________            p.m. _____________________(2nd reading) HR__________  Date: _______________________ a.m. _____________________(1st reading) HR___________            p.m. _____________________(2nd reading) HR__________  Date: _______________________ a.m. _____________________(1st reading) HR___________            p.m. _____________________(2nd reading) HR__________ Date: _______________________ a.m. _____________________(1st reading) HR___________            p.m. _____________________(2nd reading) HR__________  Date: _______________________ a.m. _____________________(1st reading) HR___________            p.m. _____________________(2nd reading) HR__________ Date: _______________________ a.m. _____________________(1st reading) HR___________            p.m. _____________________(2nd reading) HR__________ Date: _______________________ a.m. _____________________(1st reading) HR___________            p.m. _____________________(2nd  reading) HR__________  Date: _______________________ a.m. _____________________(1st reading) HR___________            p.m. _____________________(2nd reading) HR__________  Date: _______________________ a.m. _____________________(1st reading) HR___________            p.m. _____________________(2nd reading) HR__________  Date: _______________________ a.m. _____________________(1st reading) HR___________            p.m. _____________________(2nd reading) HR__________ Date: _______________________ a.m. _____________________(1st reading) HR___________            p.m. _____________________(2nd reading) HR__________  Date: _______________________ a.m. _____________________(1st reading) HR___________            p.m. _____________________(2nd reading) HR__________ Date: _______________________ a.m. _____________________(1st reading) HR___________            p.m. _____________________(2nd reading) HR__________ Date: _______________________ a.m. _____________________(1st reading) HR___________            p.m. _____________________(2nd reading) HR__________  Date: _______________________ a.m. _____________________(1st reading) HR___________            p.m. _____________________(2nd reading) HR__________  Date: _______________________ a.m. _____________________(1st reading) HR___________            p.m. _____________________(2nd reading) HR__________  Date: _______________________ a.m. _____________________(1st reading) HR___________            p.m. _____________________(2nd reading) HR__________ Date: _______________________ a.m. _____________________(1st reading) HR___________  p.m. _____________________(2nd reading) HR__________  Date: _______________________ a.m. _____________________(1st reading) HR___________            p.m. _____________________(2nd reading) HR__________ Date: _______________________ a.m. _____________________(1st reading)  HR___________            p.m. _____________________(2nd reading) HR__________ Date: _______________________ a.m. _____________________(1st reading) HR___________            p.m. _____________________(2nd reading) HR__________  Date: _______________________ a.m. _____________________(1st reading) HR___________            p.m. _____________________(2nd reading) HR__________  Date: _______________________ a.m. _____________________(1st reading) HR___________            p.m. _____________________(2nd reading) HR__________  Date: _______________________ a.m. _____________________(1st reading) HR___________            p.m. _____________________(2nd reading) HR__________ Date: _______________________ a.m. _____________________(1st reading) HR___________            p.m. _____________________(2nd reading) HR__________  Date: _______________________ a.m. _____________________(1st reading) HR___________            p.m. _____________________(2nd reading) HR__________ Date: _______________________ a.m. _____________________(1st reading) HR___________            p.m. _____________________(2nd reading) HR__________ Date: _______________________ a.m. _____________________(1st reading) HR___________            p.m. _____________________(2nd reading) HR__________  Date: _______________________ a.m. _____________________(1st reading) HR___________            p.m. _____________________(2nd reading) HR__________  Date: _______________________ a.m. _____________________(1st reading) HR___________            p.m. _____________________(2nd reading) HR__________  Date: _______________________ a.m. _____________________(1st reading) HR___________            p.m. _____________________(2nd reading) HR__________ Date: _______________________ a.m. _____________________(1st reading) HR___________            p.m. _____________________(2nd reading) HR__________  Date:  _______________________ a.m. _____________________(1st reading) HR___________            p.m. _____________________(2nd reading) HR__________ Date: _______________________ a.m. _____________________(1st reading) HR___________            p.m. _____________________(2nd reading) HR__________ Date: _______________________ a.m. _____________________(1st reading) HR___________            p.m. _____________________(2nd reading) HR__________  Date: _______________________ a.m. _____________________(1st reading) HR___________            p.m. _____________________(2nd reading) HR__________  Date: _______________________ a.m. _____________________(1st reading) HR___________            p.m. _____________________(2nd reading) HR__________  Date: _______________________ a.m. _____________________(1st reading) HR___________            p.m. _____________________(2nd reading) HR__________ Date: _______________________ a.m. _____________________(1st reading) HR___________            p.m. _____________________(2nd reading) HR__________  Date: _______________________ a.m. _____________________(1st reading) HR___________            p.m. _____________________(2nd reading) HR__________ Date: _______________________ a.m. _____________________(1st reading) HR___________            p.m. _____________________(2nd reading) HR__________ Date: _______________________ a.m. _____________________(1st reading) HR___________            p.m. _____________________(2nd reading) HR__________  Date: _______________________ a.m. _____________________(1st reading) HR___________            p.m. _____________________(2nd reading) HR__________  Date: _______________________ a.m. _____________________(1st reading) HR___________            p.m. _____________________(2nd reading) HR__________  Date: _______________________ a.m. _____________________(1st reading) HR___________            p.m.  _____________________(2nd reading) HR__________ Date: _______________________ a.m. _____________________(1st reading) HR___________  p.m. _____________________(2nd reading) HR__________  Date: _______________________ a.m. _____________________(1st reading) HR___________            p.m. _____________________(2nd reading) HR__________ Date: _______________________ a.m. _____________________(1st reading) HR___________            p.m. _____________________(2nd reading) HR__________ Date: _______________________ a.m. _____________________(1st reading) HR___________            p.m. _____________________(2nd reading) HR__________  Date: _______________________ a.m. _____________________(1st reading) HR___________            p.m. _____________________(2nd reading) HR__________  Date: _______________________ a.m. _____________________(1st reading) HR___________            p.m. _____________________(2nd reading) HR__________  Date: _______________________ a.m. _____________________(1st reading) HR___________            p.m. _____________________(2nd reading) HR__________   This information is not intended to replace advice given to you by your health care provider. Make sure you discuss any questions you have with your health care provider. Document Revised: 03/11/2020 Document Reviewed: 03/11/2020 Elsevier Patient Education  2021 [employer].

## 2023-10-13 NOTE — Progress Notes (Signed)
Cardiology Office Note:    Date:  10/13/2023   ID:  Nicholas Wong, DOB 03/27/1951, MRN [id number]  PCP:  Paulina Fusi, MD  Cardiologist:  Garwin Brothers, MD   Referring MD: Paulina Fusi, MD    ASSESSMENT:    1. Primary hypertension   2. Hyperlipidemia, unspecified hyperlipidemia type   3. Chest pain of uncertain etiology   4. Cardiac murmur    PLAN:    In order of problems listed above:  Primary prevention stressed with the patient.  Importance of compliance with diet medication stressed and patient verbalized standing. Chest pain: Very atypical for coronary etiology.  I prescribed him nitroglycerin and explained its use.  In view of concerns and the fact that he wants to continue playing aggressive tennis I told him we will do exercise stress Cardiolite and he is agreeable. Elevated blood pressure: Essential hypertension: He will keep a track of his blood pressures and bring it to [place] when he comes for the stress test. Cardiac murmur: Echocardiogram will be done to assess murmur heard on auscultation. Cardiac and coronary restratification: I discussed calcium score and he is agreeable. Patient will be seen in follow-up appointment in 6 months or earlier if the patient has any concerns.    Medication Adjustments/Labs and Tests Ordered: Current medicines are reviewed at length with the patient today.  Concerns regarding medicines are outlined above.  Orders Placed This Encounter  Procedures   EKG 12-Lead   No orders of the defined types were placed in this encounter.    History of Present Illness:    Nicholas Wong is a 72 y.o. male who is being seen today for the evaluation of chest pain at the request of Paulina Fusi, MD. patient is a pleasant 72 year old male.  He has past medical history of essential hypertension.  He is a very avid and aggressive tennis player.  He tells me several days ago.  Chest pain during playing tennis therefore he was referred  here.  Subsequently he has played tennis very aggressively and with this he has had no symptoms.  No orthopnea or PND.  No radiation of the symptoms to the neck or to the arms.  His fiance accompanies him for this visit.  Past Medical History:  Diagnosis Date   BMI 20.0-20.9, adult    Cellulitis 05/01/2020   Closed fracture of middle phalanx of ring finger 04/12/2017   Decreased ROM of finger 04/12/2017   Diverticulosis    Erectile dysfunction    History of colonic polyps    Hyperlipidemia    Hypertension    Insomnia    Left leg injury 05/01/2020   Pain in right finger(s) 04/12/2017   Puncture wound of left lower leg 05/01/2020    Past Surgical History:  Procedure Laterality Date   BACK SURGERY  05/27/2013   EXTERNAL FIXATION OF FINGER Right 03/28/2017   Procedure: RIGHT RING FINGER CLOSED REDUCTION WITH EXTERNAL FIXATION;  Surgeon: Betha Loa, MD;  Location: [facility];  Service: Orthopedics;  Laterality: Right;   KNEE ARTHROSCOPY Right 03/2017    Current Medications: Current Meds  Medication Sig   lisinopril (ZESTRIL) 5 MG tablet Take 5 mg by mouth daily.   sildenafil (REVATIO) 20 MG tablet Take 20 mg by mouth as needed (erectile dysfunction).   zolpidem (AMBIEN) 10 MG tablet Take 10 mg by mouth at bedtime.     Allergies:   Patient has no known allergies.   Social  History   Socioeconomic History   Marital status: Divorced    Spouse name: Not on file   Number of children: Not on file   Years of education: Not on file   Highest education level: Not on file  Occupational History   Not on file  Tobacco Use   Smoking status: Never   Smokeless tobacco: Never  Substance and Sexual Activity   Alcohol use: Yes    Comment: social   Drug use: No   Sexual activity: Not on file  Other Topics Concern   Not on file  Social History Narrative   Not on file   Social Determinants of Health   Financial Resource Strain: Not on file  Food Insecurity: Not on  file  Transportation Needs: Not on file  Physical Activity: Not on file  Stress: Not on file  Social Connections: Not on file     Family History: The patient's family history includes Cancer in his father; Heart disease in his brother; Hyperlipidemia in his brother; Hypertension in his brother, father, mother, and sister.  ROS:   Please see the history of present illness.    All other systems reviewed and are negative.  EKGs/Labs/Other Studies Reviewed:    The following studies were reviewed today:  EKG Interpretation Date/Time:  Friday October 13 2023 14:45:09 EST Ventricular Rate:  61 PR Interval:  168 QRS Duration:  82 QT Interval:  406 QTC Calculation: 408 R Axis:   56  Text Interpretation: Normal sinus rhythm Abnormal QRS-T angle, consider primary T wave abnormality Abnormal ECG No previous ECGs available Confirmed by Belva Crome [phone]) on 10/13/2023 1:51:56 PM     Recent Labs: No results found for requested labs within last 365 days.  Recent Lipid Panel No results found for: "CHOL", "TRIG", "HDL", "CHOLHDL", "VLDL", "LDLCALC", "LDLDIRECT"  Physical Exam:    VS:  BP (!) 152/70   Pulse 61   Ht 5' 7.6" (1.717 m)   Wt 135 lb 3.2 oz (61.3 kg)   SpO2 98%   BMI 20.80 kg/m     Wt Readings from Last 3 Encounters:  10/13/23 135 lb 3.2 oz (61.3 kg)  03/28/17 134 lb 3.2 oz (60.9 kg)     GEN: Patient is in no acute distress HEENT: Normal NECK: No JVD; No carotid bruits LYMPHATICS: No lymphadenopathy CARDIAC: S1 S2 regular, 2/6 systolic murmur at the apex. RESPIRATORY:  Clear to auscultation without rales, wheezing or rhonchi  ABDOMEN: Soft, non-tender, non-distended MUSCULOSKELETAL:  No edema; No deformity  SKIN: Warm and dry NEUROLOGIC:  Alert and oriented x 3 PSYCHIATRIC:  Normal affect    Signed, Garwin Brothers, MD  10/13/2023 2:10 PM    [facility]

## 2023-10-17 ENCOUNTER — Ambulatory Visit: Payer: Medicare Other | Attending: Cardiology

## 2023-10-17 DIAGNOSIS — R079 Chest pain, unspecified: Secondary | ICD-10-CM | POA: Insufficient documentation

## 2023-10-17 LAB — MYOCARDIAL PERFUSION IMAGING
Estimated workload: 10.1
Exercise duration (min): 9 min
LV dias vol: 88 mL (ref 62–150)
LV sys vol: 42 mL
MPHR: 148 {beats}/min
Nuc Stress EF: 52 %
Peak HR: 130 {beats}/min
Percent HR: 87 %
RPE: 18
Rest HR: 48 {beats}/min
Rest Nuclear Isotope Dose: 10.6 mCi
SDS: 1
SRS: 4
SSS: 5
Stress Nuclear Isotope Dose: 30.4 mCi
TID: 1.11

## 2023-10-17 MED ORDER — TECHNETIUM TC 99M TETROFOSMIN IV KIT
10.6000 | PACK | Freq: Once | INTRAVENOUS | Status: AC | PRN
  Administered 2023-10-17: 10.6 via INTRAVENOUS

## 2023-10-17 MED ORDER — TECHNETIUM TC 99M TETROFOSMIN IV KIT
30.4000 | PACK | Freq: Once | INTRAVENOUS | Status: AC | PRN
  Administered 2023-10-17: 30.4 via INTRAVENOUS

## 2023-10-19 DIAGNOSIS — Z961 Presence of intraocular lens: Secondary | ICD-10-CM | POA: Diagnosis not present

## 2023-10-19 DIAGNOSIS — H524 Presbyopia: Secondary | ICD-10-CM | POA: Diagnosis not present

## 2023-10-19 DIAGNOSIS — Z9849 Cataract extraction status, unspecified eye: Secondary | ICD-10-CM | POA: Diagnosis not present

## 2023-10-19 DIAGNOSIS — H5231 Anisometropia: Secondary | ICD-10-CM | POA: Diagnosis not present

## 2023-10-19 DIAGNOSIS — H52223 Regular astigmatism, bilateral: Secondary | ICD-10-CM | POA: Diagnosis not present

## 2023-10-19 DIAGNOSIS — H401132 Primary open-angle glaucoma, bilateral, moderate stage: Secondary | ICD-10-CM | POA: Diagnosis not present

## 2023-10-19 DIAGNOSIS — H47233 Glaucomatous optic atrophy, bilateral: Secondary | ICD-10-CM | POA: Diagnosis not present

## 2023-10-24 ENCOUNTER — Ambulatory Visit (HOSPITAL_BASED_OUTPATIENT_CLINIC_OR_DEPARTMENT_OTHER)
Admission: RE | Admit: 2023-10-24 | Discharge: 2023-10-24 | Disposition: A | Discharge status: Home / Self Care | Payer: Medicare Other | Source: Ambulatory Visit | Attending: Cardiology | Admitting: Cardiology

## 2023-10-24 DIAGNOSIS — R079 Chest pain, unspecified: Secondary | ICD-10-CM | POA: Insufficient documentation

## 2023-10-25 DIAGNOSIS — Z1331 Encounter for screening for depression: Secondary | ICD-10-CM | POA: Diagnosis not present

## 2023-10-25 DIAGNOSIS — Z0000 Encounter for general adult medical examination without abnormal findings: Secondary | ICD-10-CM | POA: Diagnosis not present

## 2023-10-25 DIAGNOSIS — Z9181 History of falling: Secondary | ICD-10-CM | POA: Diagnosis not present

## 2023-10-25 DIAGNOSIS — Z139 Encounter for screening, unspecified: Secondary | ICD-10-CM | POA: Diagnosis not present

## 2023-10-31 ENCOUNTER — Telehealth: Payer: Self-pay

## 2023-10-31 NOTE — Telephone Encounter (Signed)
Pt is calling for his CT calcium score results. Please advise

## 2023-11-08 ENCOUNTER — Encounter: Payer: Self-pay | Admitting: Cardiology

## 2023-11-08 DIAGNOSIS — E785 Hyperlipidemia, unspecified: Secondary | ICD-10-CM

## 2023-11-13 ENCOUNTER — Telehealth: Payer: Self-pay | Admitting: Cardiology

## 2023-11-13 DIAGNOSIS — E785 Hyperlipidemia, unspecified: Secondary | ICD-10-CM | POA: Diagnosis not present

## 2023-11-13 NOTE — Telephone Encounter (Signed)
Patient dropped off his BP record today 11/13/23 for Dr. Kem Parkinson review- placed in MD box.  KBL

## 2023-11-14 ENCOUNTER — Encounter: Payer: Self-pay | Admitting: Cardiology

## 2023-11-14 ENCOUNTER — Telehealth: Payer: Self-pay

## 2023-11-14 DIAGNOSIS — E785 Hyperlipidemia, unspecified: Secondary | ICD-10-CM

## 2023-11-14 LAB — COMPREHENSIVE METABOLIC PANEL
ALT: 13 [IU]/L (ref 0–44)
AST: 26 [IU]/L (ref 0–40)
Albumin: 4.7 g/dL (ref 3.8–4.8)
Alkaline Phosphatase: 59 [IU]/L (ref 44–121)
BUN/Creatinine Ratio: 16 (ref 10–24)
BUN: 16 mg/dL (ref 8–27)
Bilirubin Total: 1.4 mg/dL — ABNORMAL HIGH (ref 0.0–1.2)
CO2: 26 mmol/L (ref 20–29)
Calcium: 9.5 mg/dL (ref 8.6–10.2)
Chloride: 101 mmol/L (ref 96–106)
Creatinine, Ser: 1.01 mg/dL (ref 0.76–1.27)
Globulin, Total: 1.9 g/dL (ref 1.5–4.5)
Glucose: 96 mg/dL (ref 70–99)
Potassium: 4.2 mmol/L (ref 3.5–5.2)
Sodium: 138 mmol/L (ref 134–144)
Total Protein: 6.6 g/dL (ref 6.0–8.5)
eGFR: 79 mL/min/{1.73_m2} (ref >59)

## 2023-11-14 LAB — LIPID PANEL
Chol/HDL Ratio: 2.9 {ratio} (ref 0.0–5.0)
Cholesterol, Total: 216 mg/dL — ABNORMAL HIGH (ref 100–199)
HDL: 75 mg/dL (ref >39)
LDL Chol Calc (NIH): 129 mg/dL — ABNORMAL HIGH (ref 0–99)
Triglycerides: 70 mg/dL (ref 0–149)
VLDL Cholesterol Cal: 12 mg/dL (ref 5–40)

## 2023-11-14 MED ORDER — ROSUVASTATIN CALCIUM 10 MG PO TABS: 10.0000 mg | ORAL_TABLET | Freq: Every day | ORAL | 3 refills | Status: DC

## 2023-11-14 NOTE — Telephone Encounter (Signed)
MyChart 12/10

## 2023-11-14 NOTE — Telephone Encounter (Signed)
-----   Message from [place] R Revankar sent at 11/14/2023  9:02 AM EST ----- Rosuvastatin 10 mg daily and liver lipid check in 6 weeks.  Copy primary care Garwin Brothers, MD 11/14/2023 9:02 AM

## 2023-11-23 ENCOUNTER — Encounter: Payer: Self-pay | Admitting: Cardiology

## 2023-11-23 ENCOUNTER — Ambulatory Visit: Payer: Medicare Other | Attending: Cardiology | Admitting: Cardiology

## 2023-11-23 VITALS — BP 137/82 | HR 61 | Ht 67.0 in | Wt 134.0 lb

## 2023-11-23 DIAGNOSIS — I10 Essential (primary) hypertension: Secondary | ICD-10-CM | POA: Insufficient documentation

## 2023-11-23 DIAGNOSIS — I2510 Atherosclerotic heart disease of native coronary artery without angina pectoris: Secondary | ICD-10-CM | POA: Insufficient documentation

## 2023-11-23 DIAGNOSIS — E782 Mixed hyperlipidemia: Secondary | ICD-10-CM | POA: Diagnosis not present

## 2023-11-23 MED ORDER — ASPIRIN 81 MG PO TBEC: 81.0000 mg | DELAYED_RELEASE_TABLET | Freq: Every day | ORAL | Status: AC

## 2023-11-23 NOTE — Patient Instructions (Signed)
Medication Instructions:  Your physician has recommended you make the following change in your medication:   Start 81 mg coated aspirin daily.  *If you need a refill on your cardiac medications before your next appointment, please call your pharmacy*   Lab Work: None ordered If you have labs (blood work) drawn today and your tests are completely normal, you will receive your results only by: MyChart Message (if you have MyChart) OR A paper copy in the mail If you have any lab test that is abnormal or we need to change your treatment, we will call you to review the results.   Testing/Procedures: None ordered   Follow-Up: At [facility], you and your health needs are our priority.  As part of our continuing mission to provide you with exceptional heart care, we have created designated Provider Care Teams.  These Care Teams include your primary Cardiologist (physician) and Advanced Practice Providers (APPs -  Physician Assistants and Nurse Practitioners) who all work together to provide you with the care you need, when you need it.  We recommend signing up for the patient portal called "MyChart".  Sign up information is provided on this After Visit Summary.  MyChart is used to connect with patients for Virtual Visits (Telemedicine).  Patients are able to view lab/test results, encounter notes, upcoming appointments, etc.  Non-urgent messages can be sent to your provider as well.   To learn more about what you can do with MyChart, go to [email or web address].    Your next appointment:   12 month(s)  The format for your next appointment:   In Person  Provider:   Belva Crome, MD    Other Instructions none  Important Information About Sugar

## 2023-11-23 NOTE — Progress Notes (Signed)
Cardiology Office Note:    Date:  11/23/2023   ID:  Yusuf Pasternak Dacquisto, DOB December 05, 1951, MRN [id number]  PCP:  Paulina Fusi, MD  Cardiologist:  Garwin Brothers, MD   Referring MD: Paulina Fusi, MD    ASSESSMENT:    1. Coronary artery disease involving native coronary artery of native heart without angina pectoris   2. Primary hypertension   3. Mixed hyperlipidemia    PLAN:    In order of problems listed above:  Coronary artery disease: Secondary prevention stressed to the patient.  Importance of compliance with diet medication stressed any vocalized understanding.  He was advised to walk at least half an hour a day 5 days a week and he promises to do so. Mixed dyslipidemia: On lipid-lowering medications: He will be back in the next few days for follow-up lipids.  Goal LDL is 60. Essential hypertension: Blood pressure stable and diet was emphasized.  Lifestyle modification urged. Patient will be seen in follow-up appointment in 6 months or earlier if the patient has any concerns.    Medication Adjustments/Labs and Tests Ordered: Current medicines are reviewed at length with the patient today.  Concerns regarding medicines are outlined above.  No orders of the defined types were placed in this encounter.  No orders of the defined types were placed in this encounter.    No chief complaint on file.    History of Present Illness:    Daneal Larock July is a 72 y.o. male.  Patient has past medical history of recently diagnosed markedly elevated calcium score, essential hypertension and mixed dyslipidemia.  He has good exercise tolerance with no symptoms.  He denies any chest pain orthopnea or PND.  At the time of my evaluation, the patient is alert awake oriented and in no distress.  Past Medical History:  Diagnosis Date   BMI 20.0-20.9, adult    Cardiac murmur 10/13/2023   Cellulitis 05/01/2020   Chest pain of uncertain etiology 10/13/2023   Closed fracture of middle  phalanx of ring finger 04/12/2017   Decreased ROM of finger 04/12/2017   Diverticulosis    Erectile dysfunction    History of colonic polyps    Hyperlipidemia    Hypertension    Insomnia    Left leg injury 05/01/2020   Pain in right finger(s) 04/12/2017   Puncture wound of left lower leg 05/01/2020    Past Surgical History:  Procedure Laterality Date   BACK SURGERY  05/27/2013   EXTERNAL FIXATION OF FINGER Right 03/28/2017   Procedure: RIGHT RING FINGER CLOSED REDUCTION WITH EXTERNAL FIXATION;  Surgeon: Betha Loa, MD;  Location: [facility];  Service: Orthopedics;  Laterality: Right;   KNEE ARTHROSCOPY Right 03/2017    Current Medications: Current Meds  Medication Sig   lisinopril (ZESTRIL) 5 MG tablet Take 5 mg by mouth daily.   nitroGLYCERIN (NITROSTAT) 0.4 MG SL tablet Place 1 tablet (0.4 mg total) under the tongue every 5 (five) minutes as needed for chest pain.   rosuvastatin (CRESTOR) 10 MG tablet Take 1 tablet (10 mg total) by mouth daily.   sildenafil (REVATIO) 20 MG tablet Take 20 mg by mouth as needed (erectile dysfunction).   zolpidem (AMBIEN) 10 MG tablet Take 10 mg by mouth at bedtime.     Allergies:   Patient has no known allergies.   Social History   Socioeconomic History   Marital status: Divorced    Spouse name: Not on file   Number of  children: Not on file   Years of education: Not on file   Highest education level: Not on file  Occupational History   Not on file  Tobacco Use   Smoking status: Never   Smokeless tobacco: Never  Substance and Sexual Activity   Alcohol use: Yes    Comment: social   Drug use: No   Sexual activity: Not on file  Other Topics Concern   Not on file  Social History Narrative   Not on file   Social Drivers of Health   Financial Resource Strain: Not on file  Food Insecurity: Not on file  Transportation Needs: Not on file  Physical Activity: Not on file  Stress: Not on file  Social Connections: Not  on file     Family History: The patient's family history includes Cancer in his father; Heart disease in his brother; Hyperlipidemia in his brother; Hypertension in his brother, father, mother, and sister.  ROS:   Please see the history of present illness.    All other systems reviewed and are negative.  EKGs/Labs/Other Studies Reviewed:    The following studies were reviewed today: I discussed my findings with the patient at length   Recent Labs: 11/13/2023: ALT 13; BUN 16; Creatinine, Ser 1.01; Potassium 4.2; Sodium 138  Recent Lipid Panel    Component Value Date/Time   CHOL 216 (H) 11/13/2023 0858   TRIG 70 11/13/2023 0858   HDL 75 11/13/2023 0858   CHOLHDL 2.9 11/13/2023 0858   LDLCALC 129 (H) 11/13/2023 0858    Physical Exam:    VS:  BP 137/82 (BP Location: Left Arm, Patient Position: Sitting, Cuff Size: Normal)   Pulse 61   Ht 5\' 7"  (1.702 m)   Wt 134 lb (60.8 kg)   SpO2 97%   BMI 20.99 kg/m     Wt Readings from Last 3 Encounters:  11/23/23 134 lb (60.8 kg)  10/17/23 135 lb (61.2 kg)  10/13/23 135 lb 3.2 oz (61.3 kg)     GEN: Patient is in no acute distress HEENT: Normal NECK: No JVD; No carotid bruits LYMPHATICS: No lymphadenopathy CARDIAC: Hear sounds regular, 2/6 systolic murmur at the apex. RESPIRATORY:  Clear to auscultation without rales, wheezing or rhonchi  ABDOMEN: Soft, non-tender, non-distended MUSCULOSKELETAL:  No edema; No deformity  SKIN: Warm and dry NEUROLOGIC:  Alert and oriented x 3 PSYCHIATRIC:  Normal affect   Signed, Garwin Brothers, MD  11/23/2023 2:46 PM    [facility]

## 2023-12-27 DIAGNOSIS — E785 Hyperlipidemia, unspecified: Secondary | ICD-10-CM | POA: Diagnosis not present

## 2023-12-28 LAB — HEPATIC FUNCTION PANEL
ALT: 21 [IU]/L (ref 0–44)
AST: 25 [IU]/L (ref 0–40)
Albumin: 4.9 g/dL — ABNORMAL HIGH (ref 3.8–4.8)
Alkaline Phosphatase: 62 [IU]/L (ref 44–121)
Bilirubin Total: 1.5 mg/dL — ABNORMAL HIGH (ref 0.0–1.2)
Bilirubin, Direct: 0.41 mg/dL — ABNORMAL HIGH (ref 0.00–0.40)
Total Protein: 7.2 g/dL (ref 6.0–8.5)

## 2023-12-28 LAB — LIPID PANEL
Chol/HDL Ratio: 2 {ratio} (ref 0.0–5.0)
Cholesterol, Total: 152 mg/dL (ref 100–199)
HDL: 75 mg/dL (ref >39)
LDL Chol Calc (NIH): 63 mg/dL (ref 0–99)
Triglycerides: 71 mg/dL (ref 0–149)
VLDL Cholesterol Cal: 14 mg/dL (ref 5–40)

## 2024-01-10 DIAGNOSIS — Z6820 Body mass index (BMI) 20.0-20.9, adult: Secondary | ICD-10-CM | POA: Diagnosis not present

## 2024-01-10 DIAGNOSIS — J329 Chronic sinusitis, unspecified: Secondary | ICD-10-CM | POA: Diagnosis not present

## 2024-04-09 DIAGNOSIS — G4700 Insomnia, unspecified: Secondary | ICD-10-CM | POA: Diagnosis not present

## 2024-04-09 DIAGNOSIS — N529 Male erectile dysfunction, unspecified: Secondary | ICD-10-CM | POA: Diagnosis not present

## 2024-04-09 DIAGNOSIS — I10 Essential (primary) hypertension: Secondary | ICD-10-CM | POA: Diagnosis not present

## 2024-04-09 DIAGNOSIS — E785 Hyperlipidemia, unspecified: Secondary | ICD-10-CM | POA: Diagnosis not present

## 2024-04-09 DIAGNOSIS — Z6820 Body mass index (BMI) 20.0-20.9, adult: Secondary | ICD-10-CM | POA: Diagnosis not present

## 2024-04-09 DIAGNOSIS — Z125 Encounter for screening for malignant neoplasm of prostate: Secondary | ICD-10-CM | POA: Diagnosis not present

## 2024-07-09 DIAGNOSIS — L570 Actinic keratosis: Secondary | ICD-10-CM | POA: Diagnosis not present

## 2024-07-09 DIAGNOSIS — L814 Other melanin hyperpigmentation: Secondary | ICD-10-CM | POA: Diagnosis not present

## 2024-07-09 DIAGNOSIS — L578 Other skin changes due to chronic exposure to nonionizing radiation: Secondary | ICD-10-CM | POA: Diagnosis not present

## 2024-07-09 DIAGNOSIS — L820 Inflamed seborrheic keratosis: Secondary | ICD-10-CM | POA: Diagnosis not present

## 2024-07-09 DIAGNOSIS — D225 Melanocytic nevi of trunk: Secondary | ICD-10-CM | POA: Diagnosis not present

## 2024-07-31 ENCOUNTER — Other Ambulatory Visit: Payer: Self-pay | Admitting: Cardiology

## 2024-07-31 DIAGNOSIS — E785 Hyperlipidemia, unspecified: Secondary | ICD-10-CM

## 2024-11-08 DIAGNOSIS — L820 Inflamed seborrheic keratosis: Secondary | ICD-10-CM | POA: Diagnosis not present

## 2024-11-08 DIAGNOSIS — L570 Actinic keratosis: Secondary | ICD-10-CM | POA: Diagnosis not present

## 2024-11-08 DIAGNOSIS — D2239 Melanocytic nevi of other parts of face: Secondary | ICD-10-CM | POA: Diagnosis not present

## 2024-11-18 ENCOUNTER — Other Ambulatory Visit: Payer: Self-pay

## 2024-11-18 DIAGNOSIS — I2510 Atherosclerotic heart disease of native coronary artery without angina pectoris: Secondary | ICD-10-CM

## 2024-11-20 ENCOUNTER — Encounter: Payer: Self-pay | Admitting: Cardiology

## 2024-11-20 ENCOUNTER — Ambulatory Visit: Attending: Cardiology | Admitting: Cardiology

## 2024-11-20 VITALS — BP 130/78 | HR 77 | Ht 67.6 in | Wt 136.0 lb

## 2024-11-20 DIAGNOSIS — I2510 Atherosclerotic heart disease of native coronary artery without angina pectoris: Secondary | ICD-10-CM | POA: Insufficient documentation

## 2024-11-20 DIAGNOSIS — I7121 Aneurysm of the ascending aorta, without rupture: Secondary | ICD-10-CM | POA: Insufficient documentation

## 2024-11-20 DIAGNOSIS — I10 Essential (primary) hypertension: Secondary | ICD-10-CM | POA: Diagnosis present

## 2024-11-20 DIAGNOSIS — E782 Mixed hyperlipidemia: Secondary | ICD-10-CM | POA: Insufficient documentation

## 2024-11-20 NOTE — Addendum Note (Signed)
 Addended by: GLENFORD ALAN CROME on: 11/20/2024 02:01 PM   Modules accepted: Orders

## 2024-11-20 NOTE — Progress Notes (Signed)
 Cardiology Office Note:    Date:  11/20/2024   ID:  Nicholas Wong, DOB [birth year], MRN [id number]  PCP:  Keren Vicenta BRAVO, MD  Cardiologist:  Jennifer JONELLE Crape, MD   Referring MD: Keren Vicenta BRAVO, MD    ASSESSMENT:    1. Mixed hyperlipidemia   2. Coronary artery disease involving native coronary artery of native heart without angina pectoris   3. Primary hypertension   4. Aneurysm of ascending aorta without rupture    PLAN:    In order of problems listed above:  Coronary artery disease: Secondary prevention stressed with the patient.  Importance of compliance with diet medication stressed and patient verbalized standing.  Patient was advised to walk at least half an hour a day on a daily basis. Essential hypertension: Blood pressure stable and diet was emphasized.  Lifestyle modification urged. Mixed dyslipidemia: On lipid-lowering medications followed by primary care.  Goal LDL is less than 60 and patient's LDL was 55.  He is going to have repeat blood work in the next few days and send us  a copy. Ascending aortic aneurysm: Stable and patient will have a CT scan on an annual basis coming up this December now and also symptoms of rupture and dissection mentioned in education given.  Questions were answered to his satisfaction. Patient will be seen in follow-up appointment in 9 months or earlier if the patient has any concerns.    Medication Adjustments/Labs and Tests Ordered: Current medicines are reviewed at length with the patient today.  Concerns regarding medicines are outlined above.  Orders Placed This Encounter  Procedures   EKG 12-Lead   No orders of the defined types were placed in this encounter.    No chief complaint on file.    History of Present Illness:    Nicholas Wong is a 73 y.o. male.  Patient has a history of coronary artery disease and markedly elevated calcium  score, mixed dyslipidemia and essential hypertension.  He denies any problems at this  time and takes care of activities of daily living.  He mentions to me that he played tennis this morning without any symptoms.  He exercises regularly but has been lax in the cold weather.  At the time of my evaluation, the patient is alert awake oriented and in no distress.  Past Medical History:  Diagnosis Date   BMI 20.0-20.9, adult    CAD (coronary artery disease) 11/23/2023   Cardiac murmur 10/13/2023   Cellulitis 05/01/2020   Chest pain of uncertain etiology 10/13/2023   Closed fracture of middle phalanx of ring finger 04/12/2017   Decreased ROM of finger 04/12/2017   Diverticulosis    Erectile dysfunction    History of colonic polyps    Hyperlipidemia    Hypertension    Insomnia    Left leg injury 05/01/2020   Pain in right finger(s) 04/12/2017   Puncture wound of left lower leg 05/01/2020    Past Surgical History:  Procedure Laterality Date   BACK SURGERY  05/27/2013   EXTERNAL FIXATION OF FINGER Right 03/28/2017   Procedure: RIGHT RING FINGER CLOSED REDUCTION WITH EXTERNAL FIXATION;  Surgeon: Murrell Drivers, MD;  Location: [facility];  Service: Orthopedics;  Laterality: Right;   KNEE ARTHROSCOPY Right 03/2017    Current Medications: Active Medications[1]   Allergies:   Patient has no known allergies.   Social History   Socioeconomic History   Marital status: Divorced    Spouse name: Not on file  Number of children: Not on file   Years of education: Not on file   Highest education level: Not on file  Occupational History   Not on file  Tobacco Use   Smoking status: Never   Smokeless tobacco: Never  Substance and Sexual Activity   Alcohol use: Yes    Comment: social   Drug use: No   Sexual activity: Not on file  Other Topics Concern   Not on file  Social History Narrative   Not on file   Social Drivers of Health   Tobacco Use: Low Risk (11/20/2024)   Patient History    Smoking Tobacco Use: Never    Smokeless Tobacco Use: Never     Passive Exposure: Not on file  Financial Resource Strain: Not on file  Food Insecurity: Not on file  Transportation Needs: Not on file  Physical Activity: Not on file  Stress: Not on file  Social Connections: Not on file  Depression (EYV7-0): Not on file  Alcohol Screen: Not on file  Housing: Not on file  Utilities: Not on file  Health Literacy: Not on file     Family History: The patient's family history includes Cancer in his father; Heart disease in his brother; Hyperlipidemia in his brother; Hypertension in his brother, father, mother, and sister.  ROS:   Please see the history of present illness.    All other systems reviewed and are negative.  EKGs/Labs/Other Studies Reviewed:    The following studies were reviewed today: .SABRAEKG Interpretation Date/Time:  Wednesday November 20 2024 13:03:02 EST Ventricular Rate:  77 PR Interval:  164 QRS Duration:  76 QT Interval:  354 QTC Calculation: 400 R Axis:   38  Text Interpretation: Sinus rhythm with occasional Premature ventricular complexes Possible Anterior infarct , age undetermined ST & T wave abnormality, consider inferior ischemia Abnormal ECG When compared with ECG of 13-Oct-2023 14:45, Premature ventricular complexes are now Present Nonspecific T wave abnormality now evident in Lateral leads Confirmed by Edwyna Backers [phone]) on 11/20/2024 1:25:15 PM     Recent Labs: 12/27/2023: ALT 21  Recent Lipid Panel    Component Value Date/Time   CHOL 152 12/27/2023 1013   TRIG 71 12/27/2023 1013   HDL 75 12/27/2023 1013   CHOLHDL 2.0 12/27/2023 1013   LDLCALC 63 12/27/2023 1013    Physical Exam:    VS:  BP 130/78   Pulse 77   Ht 5' 7.6 (1.717 m)   Wt 136 lb (61.7 kg)   SpO2 98%   BMI 20.92 kg/m     Wt Readings from Last 3 Encounters:  11/20/24 136 lb (61.7 kg)  11/23/23 134 lb (60.8 kg)  10/17/23 135 lb (61.2 kg)     GEN: Patient is in no acute distress HEENT: Normal NECK: No JVD; No carotid  bruits LYMPHATICS: No lymphadenopathy CARDIAC: Hear sounds regular, 2/6 systolic murmur at the apex. RESPIRATORY:  Clear to auscultation without rales, wheezing or rhonchi  ABDOMEN: Soft, non-tender, non-distended MUSCULOSKELETAL:  No edema; No deformity  SKIN: Warm and dry NEUROLOGIC:  Alert and oriented x 3 PSYCHIATRIC:  Normal affect   Signed, Backers JONELLE Edwyna, MD  11/20/2024 1:26 PM    [facility]     [1]  Current Meds  Medication Sig   aspirin  EC 81 MG tablet Take 1 tablet (81 mg total) by mouth daily. Swallow whole.   lisinopril (ZESTRIL) 5 MG tablet Take 5 mg by mouth daily.   nitroGLYCERIN  (NITROSTAT )  0.4 MG SL tablet Place 1 tablet (0.4 mg total) under the tongue every 5 (five) minutes as needed for chest pain.   rosuvastatin  (CRESTOR ) 10 MG tablet Take 1 tablet (10 mg total) by mouth daily.   sildenafil (REVATIO) 20 MG tablet Take 20 mg by mouth as needed (erectile dysfunction).   zolpidem (AMBIEN) 10 MG tablet Take 10 mg by mouth at bedtime.

## 2024-11-20 NOTE — Patient Instructions (Addendum)

## 2024-12-03 ENCOUNTER — Ambulatory Visit (HOSPITAL_BASED_OUTPATIENT_CLINIC_OR_DEPARTMENT_OTHER)
Admission: RE | Admit: 2024-12-03 | Discharge: 2024-12-03 | Disposition: A | Discharge status: Home / Self Care | Source: Ambulatory Visit | Attending: Cardiology | Admitting: Cardiology

## 2024-12-03 DIAGNOSIS — I7121 Aneurysm of the ascending aorta, without rupture: Secondary | ICD-10-CM | POA: Diagnosis not present

## 2024-12-03 MED ORDER — IOHEXOL 350 MG/ML SOLN
100.0000 mL | Freq: Once | INTRAVENOUS | Status: AC | PRN
  Administered 2024-12-03: 100 mL via INTRAVENOUS

## 2024-12-10 ENCOUNTER — Ambulatory Visit: Payer: Self-pay | Admitting: Cardiology

## 2024-12-11 NOTE — Telephone Encounter (Signed)
 Patient read MyChart message regarding CT results on 12/10/24.  Alan, RN
